# Patient Record
Sex: Male | Born: 1970 | Race: White | Hispanic: No | Marital: Married | Smoking: Never smoker
Health system: Southern US, Community
[De-identification: ages and names within clinical notes are randomized; demographics above are authoritative.]

## PROBLEM LIST (undated history)

## (undated) DIAGNOSIS — I10 Essential (primary) hypertension: Secondary | ICD-10-CM

## (undated) DIAGNOSIS — M545 Low back pain, unspecified: Secondary | ICD-10-CM

## (undated) DIAGNOSIS — R200 Anesthesia of skin: Secondary | ICD-10-CM

## (undated) HISTORY — PX: OTHER SURGICAL HISTORY: SHX169

---

## 2016-08-21 ENCOUNTER — Ambulatory Visit: Payer: Self-pay | Admitting: Specialist

## 2016-09-04 ENCOUNTER — Encounter (HOSPITAL_COMMUNITY): Payer: Self-pay

## 2016-09-04 ENCOUNTER — Encounter (HOSPITAL_COMMUNITY)
Admission: RE | Admit: 2016-09-04 | Discharge: 2016-09-04 | Disposition: A | Discharge status: Home / Self Care | Payer: Managed Care, Other (non HMO) | Source: Ambulatory Visit | Attending: Specialist | Admitting: Specialist

## 2016-09-04 ENCOUNTER — Ambulatory Visit (HOSPITAL_COMMUNITY)
Admission: RE | Admit: 2016-09-04 | Discharge: 2016-09-04 | Disposition: A | Discharge status: Home / Self Care | Payer: Managed Care, Other (non HMO) | Source: Ambulatory Visit | Attending: Specialist | Admitting: Specialist

## 2016-09-04 DIAGNOSIS — M4807 Spinal stenosis, lumbosacral region: Secondary | ICD-10-CM | POA: Insufficient documentation

## 2016-09-04 DIAGNOSIS — M5126 Other intervertebral disc displacement, lumbar region: Secondary | ICD-10-CM

## 2016-09-04 DIAGNOSIS — Z01818 Encounter for other preprocedural examination: Secondary | ICD-10-CM | POA: Insufficient documentation

## 2016-09-04 DIAGNOSIS — I10 Essential (primary) hypertension: Secondary | ICD-10-CM | POA: Diagnosis not present

## 2016-09-04 HISTORY — DX: Anesthesia of skin: R20.0

## 2016-09-04 HISTORY — DX: Low back pain: M54.5

## 2016-09-04 HISTORY — DX: Essential (primary) hypertension: I10

## 2016-09-04 HISTORY — DX: Low back pain, unspecified: M54.50

## 2016-09-04 LAB — CBC
HCT: 41.6 % (ref 39.0–52.0)
Hemoglobin: 14.7 g/dL (ref 13.0–17.0)
MCH: 31.6 pg (ref 26.0–34.0)
MCHC: 35.3 g/dL (ref 30.0–36.0)
MCV: 89.5 fL (ref 78.0–100.0)
PLATELETS: 249 10*3/uL (ref 150–400)
RBC: 4.65 MIL/uL (ref 4.22–5.81)
RDW: 12.7 % (ref 11.5–15.5)
WBC: 6.2 10*3/uL (ref 4.0–10.5)

## 2016-09-04 LAB — BASIC METABOLIC PANEL
Anion gap: 9 (ref 5–15)
BUN: 14 mg/dL (ref 6–20)
CHLORIDE: 104 mmol/L (ref 101–111)
CO2: 26 mmol/L (ref 22–32)
CREATININE: 0.94 mg/dL (ref 0.61–1.24)
Calcium: 9.2 mg/dL (ref 8.9–10.3)
GFR calc Af Amer: >60 mL/min (ref >60)
GFR calc non Af Amer: >60 mL/min (ref >60)
GLUCOSE: 123 mg/dL — AB (ref 65–99)
Potassium: 3.7 mmol/L (ref 3.5–5.1)
Sodium: 139 mmol/L (ref 135–145)

## 2016-09-04 LAB — SURGICAL PCR SCREEN
MRSA, PCR: NEGATIVE
Staphylococcus aureus: NEGATIVE

## 2016-09-04 NOTE — Patient Instructions (Signed)
Jose Logan  09/04/2016   Your procedure is scheduled on: 09-14-16  [facility]  Entrance [place]  elevators to 3rd floor to  Short Stay Center at 530  AM.  Call this number if you have problems the morning of surgery [phone]   Remember: ONLY 1 PERSON MAY GO WITH YOU TO SHORT STAY TO GET  READY MORNING OF YOUR SURGERY.  Do not eat food or drink liquids :After Midnight.     Take these medicines the morning of surgery with A SIP OF WATER: none                               You may not have any metal on your body including hair pins and              piercings  Do not wear jewelry, make-up, lotions, powders or perfumes, deodorant             Do not wear nail polish.  Do not shave  48 hours prior to surgery.              Men may shave face and neck.   Do not bring valuables to the hospital.  IS NOT             RESPONSIBLE   FOR VALUABLES.  Contacts, dentures or bridgework may not be worn into surgery.  Leave suitcase in the car. After surgery it may be brought to your room.                  Please read over the following fact sheets you were given: _____________________________________________________________________             [facility] - Preparing for [facility] Before surgery, you can play an important role.  Because skin is not sterile, your skin needs to be as free of germs as possible.  You can reduce the number of germs on your skin by washing with CHG (chlorahexidine gluconate) soap before surgery.  CHG is an antiseptic cleaner which kills germs and bonds with the skin to continue killing germs even after washing. Please DO NOT use if you have an allergy to CHG or antibacterial soaps.  If your skin becomes reddened/irritated stop using the CHG and inform your nurse when you arrive at Short Stay. Do not shave (including legs and underarms) for at least 48 hours prior to the first CHG shower.  You may shave your  face/neck. Please follow these instructions carefully:  1.  Shower with CHG Soap the night before surgery and the  morning of Surgery.  2.  If you choose to wash your hair, wash your hair first as usual with your  normal  shampoo.  3.  After you shampoo, rinse your hair and body thoroughly to remove the  shampoo.                           4.  Use CHG as you would any other liquid soap.  You can apply chg directly  to the skin and wash                       Gently with a scrungie or clean washcloth.  5.  Apply the CHG Soap to  your body ONLY FROM THE NECK DOWN.   Do not use on face/ open                           Wound or open sores. Avoid contact with eyes, ears mouth and genitals (private parts).                       Wash face,  Genitals (private parts) with your normal soap.             6.  Wash thoroughly, paying special attention to the area where your surgery  will be performed.  7.  Thoroughly rinse your body with warm water from the neck down.  8.  DO NOT shower/wash with your normal soap after using and rinsing off  the CHG Soap.                9.  Pat yourself dry with a clean towel.            10.  Wear clean pajamas.            11.  Place clean sheets on your bed the night of your first shower and do not  sleep with pets. Day of Surgery : Do not apply any lotions/deodorants the morning of surgery.  Please wear clean clothes to the hospital/surgery center.  FAILURE TO FOLLOW THESE INSTRUCTIONS MAY RESULT IN THE CANCELLATION OF YOUR SURGERY PATIENT SIGNATURE_________________________________  NURSE SIGNATURE__________________________________  ________________________________________________________________________   [name]  An incentive spirometer is a tool that can help keep your lungs clear and active. This tool measures how well you are filling your lungs with each breath. Taking long deep breaths may help reverse or decrease the chance of developing breathing  (pulmonary) problems (especially infection) following:  A long period of time when you are unable to move or be active. BEFORE THE PROCEDURE   If the spirometer includes an indicator to show your best effort, your nurse or respiratory therapist will set it to a desired goal.  If possible, sit up straight or lean slightly forward. Try not to slouch.  Hold the incentive spirometer in an upright position. INSTRUCTIONS FOR USE  1. Sit on the edge of your bed if possible, or sit up as far as you can in bed or on a chair. 2. Hold the incentive spirometer in an upright position. 3. Breathe out normally. 4. Place the mouthpiece in your mouth and seal your lips tightly around it. 5. Breathe in slowly and as deeply as possible, raising the piston or the ball toward the top of the column. 6. Hold your breath for 3-5 seconds or for as long as possible. Allow the piston or ball to fall to the bottom of the column. 7. Remove the mouthpiece from your mouth and breathe out normally. 8. Rest for a few seconds and repeat Steps 1 through 7 at least 10 times every 1-2 hours when you are awake. Take your time and take a few normal breaths between deep breaths. 9. The spirometer may include an indicator to show your best effort. Use the indicator as a goal to work toward during each repetition. 10. After each set of 10 deep breaths, practice coughing to be sure your lungs are clear. If you have an incision (the cut made at the time of surgery), support your incision when coughing by placing a pillow or rolled up towels firmly against  it. Once you are able to get out of bed, walk around indoors and cough well. You may stop using the incentive spirometer when instructed by your caregiver.  RISKS AND COMPLICATIONS  Take your time so you do not get dizzy or light-headed.  If you are in pain, you may need to take or ask for pain medication before doing incentive spirometry. It is harder to take a deep breath if you  are having pain. AFTER USE  Rest and breathe slowly and easily.  It can be helpful to keep track of a log of your progress. Your caregiver can provide you with a simple table to help with this. If you are using the spirometer at home, follow these instructions: [place] IF:   You are having difficultly using the spirometer.  You have trouble using the spirometer as often as instructed.  Your pain medication is not giving enough relief while using the spirometer.  You develop fever of 100.5 F (38.1 C) or higher. SEEK IMMEDIATE MEDICAL CARE IF:   You cough up bloody sputum that had not been present before.  You develop fever of 102 F (38.9 C) or greater.  You develop worsening pain at or near the incision site. MAKE SURE YOU:   Understand these instructions.  Will watch your condition.  Will get help right away if you are not doing well or get worse. Document Released: 06/26/2006 Document Revised: 05/08/2011 Document Reviewed: 08/27/2006 [facility] Patient Information 2014 [place], [place].   ________________________________________________________________________

## 2016-09-11 ENCOUNTER — Ambulatory Visit: Payer: Self-pay | Admitting: Orthopedic Surgery

## 2016-09-11 NOTE — H&P (Signed)
Jose Logan is an 46 y.o. male.   Chief Complaint: back and leg pain HPI: The patient is a 46 year old male who presents today for follow up of their back. The patient is being followed for their right-sided back pain and rt leg pain and numbness. They are now 2 week(s) out from Rt. L5-S1 ESI. Symptoms reported today include: pain, pain with weightbearing, numbness, leg pain and foot pain. The patient states that they are doing poorly. The following medication has been used for pain control: none. The patient reports their current pain level to be 3 / 10. The patient presents today following ESI. The patient has reported improvement of their symptoms with: conservative measures and Cortisone injections. The patient indicates that they have questions or concerns today regarding pain and work duties. Note for "Follow-up back": He states he got maybe a day of relief from injection. States he has aching all the time.  Jose Logan follows up with right leg numbness down into the big toe. He is two weeks out from his epidural. He did not have significant relief from that.  Review of systems is negative for fevers, chest pain, shortness of breath, unexplained recent weight loss, loss of bowel or bladder function, burning with urination, joint swelling, rashes, weakness or numbness, difficulty with balance, easy bruising, excessive thirst or frequent urination.  He reports pain now down into the great toe on the right in the bottom aspect of his foot in addition to the lateral aspect of the left foot in the S1 nerve root distribution.  Past Medical History:  Diagnosis Date  . Hypertension   . Lumbar pain   . Numbness    top of right foot and left toes, dr beane aware for last year    Past Surgical History:  Procedure Laterality Date  . hemorroid surgery and colonscopy      No family history on file. Social History:  reports that he has never smoked. He uses smokeless tobacco. He reports that he  drinks alcohol. He reports that he does not use drugs.  Allergies:  Allergies  Allergen Reactions  . Tetracyclines & Related Rash     (Not in a hospital admission)  No results found for this or any previous visit (from the past 48 hour(s)). No results found.  Review of Systems  Constitutional: Negative.   HENT: Negative.   Eyes: Negative.   Respiratory: Negative.   Cardiovascular: Negative.   Gastrointestinal: Negative.   Genitourinary: Negative.   Musculoskeletal: Positive for back pain.  Skin: Negative.   Neurological: Positive for sensory change and focal weakness.  Psychiatric/Behavioral: Negative.     There were no vitals taken for this visit. Physical Exam  Constitutional: He is oriented to person, place, and time. He appears well-developed.  HENT:  Head: Normocephalic.  Eyes: Pupils are equal, round, and reactive to light.  Neck: Normal range of motion.  Cardiovascular: Normal rate.   Respiratory: Effort normal.  GI: Soft.  Musculoskeletal:  On exam, straight leg raise with buttock, thigh and calf pain on the right, buttock and thigh pain on the left. EHL is 4+/5. Trace EHL on the left. Pain with flexion and extension of the lumbar spine. Lumbar spine exam reveals no evidence of soft tissue swelling, deformity or skin ecchymosis. On palpation there is no tenderness of the lumbar spine. No flank pain with percussion. The abdomen is soft and nontender. Nontender over the trochanters. No cellulitis or lymphadenopathy.  Good range of motion  of the lumbar spine without associated pain. Straight leg raise is negative. Motor is 5/5 including EHL, tibialis anterior, plantar flexion, quadriceps and hamstrings. Patient is normoreflexic. There is no Babinski or clonus. Sensory exam is intact to light touch. Patient has good distal pulses. No DVT. No pain and normal range of motion without instability of the hips, knees and ankles.  Neurological: He is alert and oriented to  person, place, and time.  Skin: Skin is warm and dry.    MRI was again reviewed. He does have associated spinal stenosis and lateral recess stenosis at L4-5. His MRI was done at an outside facility. He has moderate central stenosis and bilateral sub-articular recess narrowing which I feel is impinging upon the L5 nerve root, right greater than left. He has a disc protrusion and lateral recess stenosis mildly at L5-S1 level on the left.  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  Assessment/Plan 1. Predominantly right lower extremity radicular pain in the L5 nerve root distribution secondary to disc protrusion, lateral recess stenosis, facet and ligamentum hypertrophy, disc space narrowing, bilateral stenosis, refractory to rest, activity modification and injection. 2. 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  We discussed options as well as a CT myelogram. I do not feel at this point in time that is necessary given the predominant L5 radiculopathy. I discussed a microlumbar decompression at L4-L5 bilaterally and possibly L5-S1 on the left. I had an extensive discussion of the risks and benefits of the lumbar decompression with the patient including bleeding, infection, damage to neurovascular structures, epidural fibrosis, CSF leak requiring repair. We also discussed increase in pain, adjacent segment disease, recurrent disc herniation, need for future surgery including repeat decompression and/or fusion. We also discussed risks of postoperative hematoma, paralysis, anesthetic complications including DVT, PE, death, cardiopulmonary dysfunction. In addition, the perioperative and postoperative courses were discussed in detail including the rehabilitative time and return to functional activity and work. I provided the patient with an illustrated handout and utilized the appropriate surgical models. Again, he has had home exercise program, activity modification  strategies to avoid re-injury.  Hopefully, with an absence or reduction of his radicular pain and with disc pressure management, we can reduce the symptoms and increase his level of activity. Right now, he is out of work and has significant disability with his current activities. I did indicate this is not a procedure designed to address back pain. Based on his multilevel disc degeneration, that would require surgical fusion, multilevel, and at this point, I do not feel that is appropriate. Again, it is predominantly buttock and into the leg at this point.  I will continue his activity modification strategies avoid re-injury. He is currently on crutches today. Again, I feel it is appropriate to proceed at this point given the failure of conservative treatment and the positions of the neural compressive lesion and nerve root and myotomal weakness and dermatomal dysesthesias.  We discussed overnight in the hospital, two weeks for suture removal and six weeks until able to participate in a progressive strengthening program. We spent considerable time discussing all these issues. His other option is to live with his symptoms, which he currently indicates he is unable to do. Current pain management program. Otherwise pretty healthy with some blood pressure issues but currently well maintained.  Plan microlumbar decompression L4-5, L5-S1  Dorothy SparkBISSELL, JACLYN M., PA-C for Dr. Shelle IronBeane 09/11/2016, 12:09 PM

## 2016-09-13 NOTE — Anesthesia Preprocedure Evaluation (Addendum)
Anesthesia Evaluation  Patient identified by MRN, date of birth, ID band Patient awake    Reviewed: Allergy & Precautions, NPO status , Patient's Chart, lab work & pertinent test results  Airway Mallampati: I  TM Distance: >3 FB Neck ROM: Full    Dental  (+) Dental Advisory Given   Pulmonary neg pulmonary ROS,    breath sounds clear to auscultation       Cardiovascular hypertension, Pt. on medications  Rhythm:Regular Rate:Normal     Neuro/Psych negative neurological ROS     GI/Hepatic negative GI ROS, Neg liver ROS,   Endo/Other  negative endocrine ROS  Renal/GU negative Renal ROS     Musculoskeletal   Abdominal   Peds  Hematology negative hematology ROS (+)   Anesthesia Other Findings   Reproductive/Obstetrics                            Lab Results  Component Value Date   WBC 6.2 09/04/2016   HGB 14.7 09/04/2016   HCT 41.6 09/04/2016   MCV 89.5 09/04/2016   PLT 249 09/04/2016   Lab Results  Component Value Date   CREATININE 0.94 09/04/2016   BUN 14 09/04/2016   NA 139 09/04/2016   K 3.7 09/04/2016   CL 104 09/04/2016   CO2 26 09/04/2016    Anesthesia Physical Anesthesia Plan  ASA: II  Anesthesia Plan: General   Post-op Pain Management:    Induction: Intravenous  PONV Risk Score and Plan: 3 and Ondansetron, Dexamethasone, Propofol and Midazolam  Airway Management Planned: Oral ETT  Additional Equipment:   Intra-op Plan:   Post-operative Plan: Extubation in OR  Informed Consent: I have reviewed the patients History and Physical, chart, labs and discussed the procedure including the risks, benefits and alternatives for the proposed anesthesia with the patient or authorized representative who has indicated his/her understanding and acceptance.   Dental advisory given  Plan Discussed with: CRNA  Anesthesia Plan Comments:        Anesthesia Quick  Evaluation

## 2016-09-14 ENCOUNTER — Encounter (HOSPITAL_COMMUNITY)
Admission: RE | Disposition: A | Discharge status: Home / Self Care | Payer: Self-pay | Source: Ambulatory Visit | Attending: Specialist

## 2016-09-14 ENCOUNTER — Ambulatory Visit (HOSPITAL_COMMUNITY): Payer: Managed Care, Other (non HMO)

## 2016-09-14 ENCOUNTER — Ambulatory Visit (HOSPITAL_COMMUNITY)
Admission: RE | Admit: 2016-09-14 | Discharge: 2016-09-14 | Disposition: A | Discharge status: Home / Self Care | Payer: Managed Care, Other (non HMO) | Source: Ambulatory Visit | Attending: Specialist | Admitting: Specialist

## 2016-09-14 ENCOUNTER — Ambulatory Visit (HOSPITAL_COMMUNITY): Payer: Managed Care, Other (non HMO) | Admitting: Anesthesiology

## 2016-09-14 ENCOUNTER — Encounter (HOSPITAL_COMMUNITY): Payer: Self-pay

## 2016-09-14 DIAGNOSIS — M48061 Spinal stenosis, lumbar region without neurogenic claudication: Secondary | ICD-10-CM | POA: Diagnosis not present

## 2016-09-14 DIAGNOSIS — F17290 Nicotine dependence, other tobacco product, uncomplicated: Secondary | ICD-10-CM | POA: Diagnosis not present

## 2016-09-14 DIAGNOSIS — I10 Essential (primary) hypertension: Secondary | ICD-10-CM | POA: Diagnosis not present

## 2016-09-14 DIAGNOSIS — M549 Dorsalgia, unspecified: Secondary | ICD-10-CM | POA: Diagnosis present

## 2016-09-14 HISTORY — PX: LUMBAR LAMINECTOMY/DECOMPRESSION MICRODISCECTOMY: SHX5026

## 2016-09-14 SURGERY — LUMBAR LAMINECTOMY/DECOMPRESSION MICRODISCECTOMY 2 LEVELS
Anesthesia: General | Site: Back

## 2016-09-14 MED ORDER — ROCURONIUM BROMIDE 100 MG/10ML IV SOLN
INTRAVENOUS | Status: DC | PRN
  Administered 2016-09-14 (×2): 10 mg via INTRAVENOUS
  Administered 2016-09-14: 40 mg via INTRAVENOUS

## 2016-09-14 MED ORDER — MENTHOL 3 MG MT LOZG: 1.0000 | LOZENGE | OROMUCOSAL | Status: DC | PRN

## 2016-09-14 MED ORDER — FENTANYL CITRATE (PF) 100 MCG/2ML IJ SOLN
INTRAMUSCULAR | Status: DC | PRN
  Administered 2016-09-14 (×2): 25 ug via INTRAVENOUS
  Administered 2016-09-14: 50 ug via INTRAVENOUS
  Administered 2016-09-14: 100 ug via INTRAVENOUS

## 2016-09-14 MED ORDER — ACETAMINOPHEN 325 MG PO TABS: 650.0000 mg | ORAL_TABLET | ORAL | Status: DC | PRN

## 2016-09-14 MED ORDER — RISAQUAD PO CAPS
1.0000 | ORAL_CAPSULE | Freq: Every day | ORAL | Status: DC
  Administered 2016-09-14: 11:00:00 1 via ORAL
  Filled 2016-09-14: qty 1

## 2016-09-14 MED ORDER — ROCURONIUM BROMIDE 50 MG/5ML IV SOSY
PREFILLED_SYRINGE | INTRAVENOUS | Status: AC
  Filled 2016-09-14: qty 5

## 2016-09-14 MED ORDER — ONDANSETRON HCL 4 MG PO TABS: 4.0000 mg | ORAL_TABLET | Freq: Four times a day (QID) | ORAL | Status: DC | PRN

## 2016-09-14 MED ORDER — PROMETHAZINE HCL 25 MG/ML IJ SOLN: 6.2500 mg | INTRAMUSCULAR | Status: DC | PRN

## 2016-09-14 MED ORDER — DEXAMETHASONE SODIUM PHOSPHATE 10 MG/ML IJ SOLN
INTRAMUSCULAR | Status: DC | PRN
  Administered 2016-09-14: 10 mg via INTRAVENOUS

## 2016-09-14 MED ORDER — PHENOL 1.4 % MT LIQD: 1.0000 | OROMUCOSAL | Status: DC | PRN

## 2016-09-14 MED ORDER — ONDANSETRON HCL 4 MG/2ML IJ SOLN
INTRAMUSCULAR | Status: DC | PRN
  Administered 2016-09-14: 4 mg via INTRAVENOUS

## 2016-09-14 MED ORDER — MIDAZOLAM HCL 5 MG/5ML IJ SOLN
INTRAMUSCULAR | Status: DC | PRN
  Administered 2016-09-14: 2 mg via INTRAVENOUS

## 2016-09-14 MED ORDER — KCL IN DEXTROSE-NACL 20-5-0.45 MEQ/L-%-% IV SOLN
INTRAVENOUS | Status: DC
  Administered 2016-09-14: 11:00:00 50 mL/h via INTRAVENOUS
  Filled 2016-09-14: qty 1000

## 2016-09-14 MED ORDER — LIDOCAINE 2% (20 MG/ML) 5 ML SYRINGE
INTRAMUSCULAR | Status: AC
  Filled 2016-09-14: qty 5

## 2016-09-14 MED ORDER — LOSARTAN POTASSIUM-HCTZ 100-12.5 MG PO TABS: 1.0000 | ORAL_TABLET | Freq: Every day | ORAL | Status: DC

## 2016-09-14 MED ORDER — ACETAMINOPHEN 10 MG/ML IV SOLN
1000.0000 mg | Freq: Once | INTRAVENOUS | Status: AC
  Administered 2016-09-14: 1000 mg via INTRAVENOUS
  Filled 2016-09-14: qty 100

## 2016-09-14 MED ORDER — HYDROCHLOROTHIAZIDE 12.5 MG PO CAPS
12.5000 mg | ORAL_CAPSULE | Freq: Every day | ORAL | Status: DC
  Administered 2016-09-14: 12.5 mg via ORAL
  Filled 2016-09-14: qty 1

## 2016-09-14 MED ORDER — METHOCARBAMOL 1000 MG/10ML IJ SOLN
500.0000 mg | Freq: Four times a day (QID) | INTRAVENOUS | Status: DC | PRN
  Filled 2016-09-14: qty 5

## 2016-09-14 MED ORDER — CHLORHEXIDINE GLUCONATE 4 % EX LIQD: 60.0000 mL | Freq: Once | CUTANEOUS | Status: DC

## 2016-09-14 MED ORDER — DOCUSATE SODIUM 100 MG PO CAPS: 100.0000 mg | ORAL_CAPSULE | Freq: Two times a day (BID) | ORAL | 1 refills | Status: DC | PRN

## 2016-09-14 MED ORDER — FENTANYL CITRATE (PF) 100 MCG/2ML IJ SOLN
INTRAMUSCULAR | Status: AC
  Filled 2016-09-14: qty 2

## 2016-09-14 MED ORDER — ONDANSETRON HCL 4 MG/2ML IJ SOLN
INTRAMUSCULAR | Status: AC
  Filled 2016-09-14: qty 2

## 2016-09-14 MED ORDER — HYDROMORPHONE HCL-NACL 0.5-0.9 MG/ML-% IV SOSY: 0.2500 mg | PREFILLED_SYRINGE | INTRAVENOUS | Status: DC | PRN

## 2016-09-14 MED ORDER — METHOCARBAMOL 500 MG PO TABS: 500.0000 mg | ORAL_TABLET | Freq: Four times a day (QID) | ORAL | 1 refills | Status: DC | PRN

## 2016-09-14 MED ORDER — SODIUM CHLORIDE 0.9 % IV SOLN
INTRAVENOUS | Status: DC | PRN
  Administered 2016-09-14: 500 mL

## 2016-09-14 MED ORDER — OXYCODONE-ACETAMINOPHEN 10-325 MG PO TABS: 1.0000 | ORAL_TABLET | ORAL | 0 refills | Status: DC | PRN

## 2016-09-14 MED ORDER — LOSARTAN POTASSIUM 50 MG PO TABS
100.0000 mg | ORAL_TABLET | Freq: Every day | ORAL | Status: DC
  Administered 2016-09-14: 100 mg via ORAL
  Filled 2016-09-14: qty 2

## 2016-09-14 MED ORDER — ONDANSETRON HCL 4 MG/2ML IJ SOLN: 4.0000 mg | Freq: Four times a day (QID) | INTRAMUSCULAR | Status: DC | PRN

## 2016-09-14 MED ORDER — BISACODYL 5 MG PO TBEC: 5.0000 mg | DELAYED_RELEASE_TABLET | Freq: Every day | ORAL | Status: DC | PRN

## 2016-09-14 MED ORDER — HYDROMORPHONE HCL-NACL 0.5-0.9 MG/ML-% IV SOSY
PREFILLED_SYRINGE | INTRAVENOUS | Status: AC
  Filled 2016-09-14: qty 2

## 2016-09-14 MED ORDER — LACTATED RINGERS IV SOLN
INTRAVENOUS | Status: DC
  Administered 2016-09-14 (×2): via INTRAVENOUS

## 2016-09-14 MED ORDER — ALUM & MAG HYDROXIDE-SIMETH 200-200-20 MG/5ML PO SUSP: 30.0000 mL | Freq: Four times a day (QID) | ORAL | Status: DC | PRN

## 2016-09-14 MED ORDER — METHOCARBAMOL 500 MG PO TABS
500.0000 mg | ORAL_TABLET | Freq: Four times a day (QID) | ORAL | Status: DC | PRN
  Administered 2016-09-14: 500 mg via ORAL
  Filled 2016-09-14: qty 1

## 2016-09-14 MED ORDER — DEXAMETHASONE SODIUM PHOSPHATE 10 MG/ML IJ SOLN
INTRAMUSCULAR | Status: AC
  Filled 2016-09-14: qty 1

## 2016-09-14 MED ORDER — PHENYLEPHRINE 40 MCG/ML (10ML) SYRINGE FOR IV PUSH (FOR BLOOD PRESSURE SUPPORT)
PREFILLED_SYRINGE | INTRAVENOUS | Status: DC | PRN
  Administered 2016-09-14 (×5): 80 ug via INTRAVENOUS

## 2016-09-14 MED ORDER — LIDOCAINE HCL (CARDIAC) 20 MG/ML IV SOLN
INTRAVENOUS | Status: DC | PRN
  Administered 2016-09-14: 100 mg via INTRAVENOUS

## 2016-09-14 MED ORDER — DOCUSATE SODIUM 100 MG PO CAPS
100.0000 mg | ORAL_CAPSULE | Freq: Two times a day (BID) | ORAL | Status: DC
  Administered 2016-09-14: 100 mg via ORAL
  Filled 2016-09-14: qty 1

## 2016-09-14 MED ORDER — MAGNESIUM CITRATE PO SOLN: 1.0000 | Freq: Once | ORAL | Status: DC | PRN

## 2016-09-14 MED ORDER — THROMBIN 5000 UNITS EX SOLR
OROMUCOSAL | Status: DC | PRN
  Administered 2016-09-14: 10000 mL via TOPICAL

## 2016-09-14 MED ORDER — HYDROMORPHONE HCL-NACL 0.5-0.9 MG/ML-% IV SOSY: 1.0000 mg | PREFILLED_SYRINGE | INTRAVENOUS | Status: DC | PRN

## 2016-09-14 MED ORDER — POLYETHYLENE GLYCOL 3350 17 G PO PACK: 17.0000 g | PACK | Freq: Every day | ORAL | 0 refills | Status: DC

## 2016-09-14 MED ORDER — PROPOFOL 10 MG/ML IV BOLUS
INTRAVENOUS | Status: DC | PRN
  Administered 2016-09-14: 200 mg via INTRAVENOUS

## 2016-09-14 MED ORDER — ACETAMINOPHEN 650 MG RE SUPP: 650.0000 mg | RECTAL | Status: DC | PRN

## 2016-09-14 MED ORDER — SUGAMMADEX SODIUM 200 MG/2ML IV SOLN
INTRAVENOUS | Status: DC | PRN
  Administered 2016-09-14: 200 mg via INTRAVENOUS

## 2016-09-14 MED ORDER — MIDAZOLAM HCL 2 MG/2ML IJ SOLN
INTRAMUSCULAR | Status: AC
  Filled 2016-09-14: qty 2

## 2016-09-14 MED ORDER — PROPOFOL 10 MG/ML IV BOLUS
INTRAVENOUS | Status: AC
  Filled 2016-09-14: qty 20

## 2016-09-14 MED ORDER — POLYETHYLENE GLYCOL 3350 17 G PO PACK: 17.0000 g | PACK | Freq: Every day | ORAL | Status: DC | PRN

## 2016-09-14 MED ORDER — THROMBIN 5000 UNITS EX SOLR
CUTANEOUS | Status: AC
  Filled 2016-09-14: qty 10000

## 2016-09-14 MED ORDER — CEFAZOLIN SODIUM-DEXTROSE 2-4 GM/100ML-% IV SOLN
2.0000 g | Freq: Three times a day (TID) | INTRAVENOUS | Status: DC
  Administered 2016-09-14: 15:00:00 2 g via INTRAVENOUS
  Filled 2016-09-14: qty 100

## 2016-09-14 MED ORDER — OXYCODONE HCL 5 MG PO TABS
5.0000 mg | ORAL_TABLET | ORAL | Status: DC | PRN
  Administered 2016-09-14: 5 mg via ORAL
  Filled 2016-09-14: qty 1

## 2016-09-14 MED ORDER — BUPIVACAINE-EPINEPHRINE (PF) 0.5% -1:200000 IJ SOLN
INTRAMUSCULAR | Status: AC
  Filled 2016-09-14: qty 30

## 2016-09-14 MED ORDER — BUPIVACAINE-EPINEPHRINE (PF) 0.5% -1:200000 IJ SOLN
INTRAMUSCULAR | Status: DC | PRN
  Administered 2016-09-14: 18 mL via PERINEURAL

## 2016-09-14 MED ORDER — CEFAZOLIN SODIUM-DEXTROSE 2-4 GM/100ML-% IV SOLN
2.0000 g | INTRAVENOUS | Status: AC
  Administered 2016-09-14: 2 g via INTRAVENOUS
  Filled 2016-09-14: qty 100

## 2016-09-14 SURGICAL SUPPLY — 48 items
BAG ZIPLOCK 12X15 (MISCELLANEOUS) ×3 IMPLANT
CLEANER TIP ELECTROSURG 2X2 (MISCELLANEOUS) ×3 IMPLANT
CLOSURE WOUND 1/2 X4 (GAUZE/BANDAGES/DRESSINGS) ×1
CLOTH 2% CHLOROHEXIDINE 3PK (PERSONAL CARE ITEMS) ×3 IMPLANT
COVER SURGICAL LIGHT HANDLE (MISCELLANEOUS) ×3 IMPLANT
DRAPE MICROSCOPE LEICA (MISCELLANEOUS) ×3 IMPLANT
DRAPE SHEET LG 3/4 BI-LAMINATE (DRAPES) IMPLANT
DRAPE SURG 17X11 SM STRL (DRAPES) ×3 IMPLANT
DRAPE UTILITY XL STRL (DRAPES) ×3 IMPLANT
DRSG AQUACEL AG ADV 3.5X 4 (GAUZE/BANDAGES/DRESSINGS) ×3 IMPLANT
DRSG AQUACEL AG ADV 3.5X 6 (GAUZE/BANDAGES/DRESSINGS) IMPLANT
DURAPREP 26ML APPLICATOR (WOUND CARE) ×3 IMPLANT
DURASEAL SPINE SEALANT 3ML (MISCELLANEOUS) IMPLANT
ELECT BLADE TIP CTD 4 INCH (ELECTRODE) ×3 IMPLANT
ELECT REM PT RETURN 15FT ADLT (MISCELLANEOUS) ×3 IMPLANT
GLOVE BIOGEL PI IND STRL 7.0 (GLOVE) ×1 IMPLANT
GLOVE BIOGEL PI INDICATOR 7.0 (GLOVE) ×2
GLOVE SURG SS PI 7.0 STRL IVOR (GLOVE) ×3 IMPLANT
GLOVE SURG SS PI 8.0 STRL IVOR (GLOVE) ×6 IMPLANT
GOWN STRL REUS W/TWL XL LVL3 (GOWN DISPOSABLE) ×6 IMPLANT
HEMOSTAT SPONGE AVITENE ULTRA (HEMOSTASIS) IMPLANT
IV CATH 14GX2 1/4 (CATHETERS) ×3 IMPLANT
KIT BASIN OR (CUSTOM PROCEDURE TRAY) ×3 IMPLANT
KIT POSITIONING SURG ANDREWS (MISCELLANEOUS) ×3 IMPLANT
MANIFOLD NEPTUNE II (INSTRUMENTS) ×3 IMPLANT
MARKER SKIN DUAL TIP RULER LAB (MISCELLANEOUS) IMPLANT
NEEDLE SPNL 18GX3.5 QUINCKE PK (NEEDLE) ×6 IMPLANT
PACK LAMINECTOMY ORTHO (CUSTOM PROCEDURE TRAY) ×3 IMPLANT
PATTIES SURGICAL .5 X.5 (GAUZE/BANDAGES/DRESSINGS) IMPLANT
PATTIES SURGICAL .75X.75 (GAUZE/BANDAGES/DRESSINGS) ×3 IMPLANT
PATTIES SURGICAL 1X1 (DISPOSABLE) IMPLANT
RUBBERBAND STERILE (MISCELLANEOUS) ×3 IMPLANT
SPONGE LAP 4X18 X RAY DECT (DISPOSABLE) IMPLANT
SPONGE SURGIFOAM ABS GEL 100 (HEMOSTASIS) ×3 IMPLANT
STAPLER VISISTAT (STAPLE) IMPLANT
STRIP CLOSURE SKIN 1/2X4 (GAUZE/BANDAGES/DRESSINGS) ×2 IMPLANT
SUT NURALON 4 0 TR CR/8 (SUTURE) IMPLANT
SUT PROLENE 3 0 PS 2 (SUTURE) ×3 IMPLANT
SUT VIC AB 1 CT1 27 (SUTURE) ×2
SUT VIC AB 1 CT1 27XBRD ANTBC (SUTURE) ×1 IMPLANT
SUT VIC AB 1-0 CT2 27 (SUTURE) ×6 IMPLANT
SUT VIC AB 2-0 CT1 27 (SUTURE)
SUT VIC AB 2-0 CT1 TAPERPNT 27 (SUTURE) IMPLANT
SUT VIC AB 2-0 CT2 27 (SUTURE) ×6 IMPLANT
SYR 3ML LL SCALE MARK (SYRINGE) ×3 IMPLANT
TOWEL OR 17X26 10 PK STRL BLUE (TOWEL DISPOSABLE) ×3 IMPLANT
TOWEL OR NON WOVEN STRL DISP B (DISPOSABLE) ×3 IMPLANT
YANKAUER SUCT BULB TIP NO VENT (SUCTIONS) ×3 IMPLANT

## 2016-09-14 NOTE — Interval H&P Note (Signed)
History and Physical Interval Note:  09/14/2016 6:59 AM  Jose MerlJeffery Krupp  has presented today for surgery, with the diagnosis of Stenosis L4-5, L5-S1  The various methods of treatment have been discussed with the patient and family. After consideration of risks, benefits and other options for treatment, the patient has consented to  Procedure(s) with comments: Decompression L4-5, L5-S1 (N/A) - 120 mins as a surgical intervention .  The patient's history has been reviewed, patient examined, no change in status, stable for surgery.  I have reviewed the patient's chart and labs.  Questions were answered to the patient's satisfaction.     Nichols Corter C

## 2016-09-14 NOTE — Discharge Instructions (Signed)

## 2016-09-14 NOTE — Anesthesia Procedure Notes (Addendum)
Procedure Name: Intubation Date/Time: 09/14/2016 7:35 AM Performed by: Noralyn Pick D Pre-anesthesia Checklist: Patient identified, Emergency Drugs available, Suction available and Patient being monitored Patient Re-evaluated:Patient Re-evaluated prior to induction Oxygen Delivery Method: Circle system utilized Preoxygenation: Pre-oxygenation with 100% oxygen Induction Type: IV induction Ventilation: Mask ventilation without difficulty Laryngoscope Size: Mac and 3 Grade View: Grade I Tube type: Oral Number of attempts: 1 Airway Equipment and Method: Stylet and Oral airway Placement Confirmation: ETT inserted through vocal cords under direct vision,  positive ETCO2 and breath sounds checked- equal and bilateral Secured at: 23 cm Tube secured with: Tape Dental Injury: Teeth and Oropharynx as per pre-operative assessment

## 2016-09-14 NOTE — H&P (View-Only) (Signed)
Jose Logan is an 46 y.o. male.   Chief Complaint: back and leg pain HPI: The patient is a 46 year old male who presents today for follow up of their back. The patient is being followed for their right-sided back pain and rt leg pain and numbness. They are now 2 week(s) out from Rt. L5-S1 ESI. Symptoms reported today include: pain, pain with weightbearing, numbness, leg pain and foot pain. The patient states that they are doing poorly. The following medication has been used for pain control: none. The patient reports their current pain level to be 3 / 10. The patient presents today following ESI. The patient has reported improvement of their symptoms with: conservative measures and Cortisone injections. The patient indicates that they have questions or concerns today regarding pain and work duties. Note for "Follow-up back": He states he got maybe a day of relief from injection. States he has aching all the time.  Jose Logan follows up with right leg numbness down into the big toe. He is two weeks out from his epidural. He did not have significant relief from that.  Review of systems is negative for fevers, chest pain, shortness of breath, unexplained recent weight loss, loss of bowel or bladder function, burning with urination, joint swelling, rashes, weakness or numbness, difficulty with balance, easy bruising, excessive thirst or frequent urination.  He reports pain now down into the great toe on the right in the bottom aspect of his foot in addition to the lateral aspect of the left foot in the S1 nerve root distribution.  Past Medical History:  Diagnosis Date  . Hypertension   . Lumbar pain   . Numbness    top of right foot and left toes, dr beane aware for last year    Past Surgical History:  Procedure Laterality Date  . hemorroid surgery and colonscopy      No family history on file. Social History:  reports that he has never smoked. He uses smokeless tobacco. He reports that he  drinks alcohol. He reports that he does not use drugs.  Allergies:  Allergies  Allergen Reactions  . Tetracyclines & Related Rash     (Not in a hospital admission)  No results found for this or any previous visit (from the past 48 hour(s)). No results found.  Review of Systems  Constitutional: Negative.   HENT: Negative.   Eyes: Negative.   Respiratory: Negative.   Cardiovascular: Negative.   Gastrointestinal: Negative.   Genitourinary: Negative.   Musculoskeletal: Positive for back pain.  Skin: Negative.   Neurological: Positive for sensory change and focal weakness.  Psychiatric/Behavioral: Negative.     There were no vitals taken for this visit. Physical Exam  Constitutional: He is oriented to person, place, and time. He appears well-developed.  HENT:  Head: Normocephalic.  Eyes: Pupils are equal, round, and reactive to light.  Neck: Normal range of motion.  Cardiovascular: Normal rate.   Respiratory: Effort normal.  GI: Soft.  Musculoskeletal:  On exam, straight leg raise with buttock, thigh and calf pain on the right, buttock and thigh pain on the left. EHL is 4+/5. Trace EHL on the left. Pain with flexion and extension of the lumbar spine. Lumbar spine exam reveals no evidence of soft tissue swelling, deformity or skin ecchymosis. On palpation there is no tenderness of the lumbar spine. No flank pain with percussion. The abdomen is soft and nontender. Nontender over the trochanters. No cellulitis or lymphadenopathy.  Good range of motion   of the lumbar spine without associated pain. Straight leg raise is negative. Motor is 5/5 including EHL, tibialis anterior, plantar flexion, quadriceps and hamstrings. Patient is normoreflexic. There is no Babinski or clonus. Sensory exam is intact to light touch. Patient has good distal pulses. No DVT. No pain and normal range of motion without instability of the hips, knees and ankles.  Neurological: He is alert and oriented to  person, place, and time.  Skin: Skin is warm and dry.    MRI was again reviewed. He does have associated spinal stenosis and lateral recess stenosis at L4-5. His MRI was done at an outside facility. He has moderate central stenosis and bilateral sub-articular recess narrowing which I feel is impinging upon the L5 nerve root, right greater than left. He has a disc protrusion and lateral recess stenosis mildly at L5-S1 level on the left.  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  Assessment/Plan 1. Predominantly right lower extremity radicular pain in the L5 nerve root distribution secondary to disc protrusion, lateral recess stenosis, facet and ligamentum hypertrophy, disc space narrowing, bilateral stenosis, refractory to rest, activity modification and injection. 2. 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  We discussed options as well as a CT myelogram. I do not feel at this point in time that is necessary given the predominant L5 radiculopathy. I discussed a microlumbar decompression at L4-L5 bilaterally and possibly L5-S1 on the left. I had an extensive discussion of the risks and benefits of the lumbar decompression with the patient including bleeding, infection, damage to neurovascular structures, epidural fibrosis, CSF leak requiring repair. We also discussed increase in pain, adjacent segment disease, recurrent disc herniation, need for future surgery including repeat decompression and/or fusion. We also discussed risks of postoperative hematoma, paralysis, anesthetic complications including DVT, PE, death, cardiopulmonary dysfunction. In addition, the perioperative and postoperative courses were discussed in detail including the rehabilitative time and return to functional activity and work. I provided the patient with an illustrated handout and utilized the appropriate surgical models. Again, he has had home exercise program, activity modification  strategies to avoid re-injury.  Hopefully, with an absence or reduction of his radicular pain and with disc pressure management, we can reduce the symptoms and increase his level of activity. Right now, he is out of work and has significant disability with his current activities. I did indicate this is not a procedure designed to address back pain. Based on his multilevel disc degeneration, that would require surgical fusion, multilevel, and at this point, I do not feel that is appropriate. Again, it is predominantly buttock and into the leg at this point.  I will continue his activity modification strategies avoid re-injury. He is currently on crutches today. Again, I feel it is appropriate to proceed at this point given the failure of conservative treatment and the positions of the neural compressive lesion and nerve root and myotomal weakness and dermatomal dysesthesias.  We discussed overnight in the hospital, two weeks for suture removal and six weeks until able to participate in a progressive strengthening program. We spent considerable time discussing all these issues. His other option is to live with his symptoms, which he currently indicates he is unable to do. Current pain management program. Otherwise pretty healthy with some blood pressure issues but currently well maintained.  Plan microlumbar decompression L4-5, L5-S1  BISSELL, JACLYN M., PA-C for Dr. Beane 09/11/2016, 12:09 PM   

## 2016-09-14 NOTE — Evaluation (Signed)
Physical Therapy Evaluation Patient Details Name: Jose Logan MRN: [id number] DOB: 05-26-1970 Today's Date: 09/14/2016   History of Present Illness  Pt s/p L4-5 microlumbar decompression  Clinical Impression  Pt admitted as above and presenting with functional mobility limitations 2* post op pain and back precautions.  Pt educated on back precautions and basic mobility techniques and currently mobilizing at sup > IND.  Pt plans dc home with family assist this date.   Follow Up Recommendations No PT follow up    Equipment Recommendations  None recommended by PT    Recommendations for Other Services       Precautions / Restrictions Precautions Precautions: Fall;Back Precaution Booklet Issued: Yes (comment) Precaution Comments: Reviewed back precautions x 2 Restrictions Weight Bearing Restrictions: No      Mobility  Bed Mobility Overal bed mobility: Needs Assistance Bed Mobility: Supine to Sit     Supine to sit: Min guard;Supervision     General bed mobility comments: cues for log roll technique and adherence to back precautions  Transfers Overall transfer level: Needs assistance   Transfers: Sit to/from Stand Sit to Stand: Min guard;Supervision         General transfer comment: cues for transition position, use of UEs to self assist and adherence to back precautions  Ambulation/Gait Ambulation/Gait assistance: Min guard;Independent Ambulation Distance (Feet): 400 Feet Assistive device: None Gait Pattern/deviations: Step-through pattern;Decreased step length - right;Decreased step length - left;Shuffle Gait velocity: decr Gait velocity interpretation: Below normal speed for age/gender General Gait Details: slight increased BOS, good stability  Stairs Stairs: Yes Stairs assistance: Min guard Stair Management: Two rails;Alternating pattern;Forwards Number of Stairs: 2    Wheelchair Mobility    Modified Rankin (Stroke Patients Only)       Balance  Overall balance assessment: No apparent balance deficits (not formally assessed)                                           Pertinent Vitals/Pain Pain Assessment: 0-10 Pain Score: 3  Pain Location: back Pain Descriptors / Indicators: Sore;Tightness Pain Intervention(s): Limited activity within patient's tolerance;Monitored during session;Premedicated before session    Home Living Family/patient expects to be discharged to:: Private residence Living Arrangements: Spouse/significant other Available Help at Discharge: Family Type of Home: House Home Access: Stairs to enter Entrance Stairs-Rails: Right;Left;Can reach both Secretary/administrator of Steps: 2 Home Layout: One level Home Equipment: None Additional Comments: Wife states she can borrow RW from friend if needed    Prior Function Level of Independence: Independent               Hand Dominance        Extremity/Trunk Assessment   Upper Extremity Assessment Upper Extremity Assessment: Overall WFL for tasks assessed    Lower Extremity Assessment Lower Extremity Assessment: Overall WFL for tasks assessed    Cervical / Trunk Assessment Cervical / Trunk Assessment: Normal  Communication   Communication: No difficulties  Cognition Arousal/Alertness: Awake/alert Behavior During Therapy: WFL for tasks assessed/performed Overall Cognitive Status: Within Functional Limits for tasks assessed                                        General Comments      Exercises     Assessment/Plan    PT Assessment  Patent does not need any further PT services  PT Problem List Decreased activity tolerance;Decreased mobility;Pain;Decreased knowledge of precautions       PT Treatment Interventions DME instruction;Gait training;Stair training;Functional mobility training;Therapeutic activities;Patient/family education    PT Goals (Current goals can be found in the Care Plan section)  Acute  Rehab PT Goals Patient Stated Goal: HOME ASAP PT Goal Formulation: All assessment and education complete, DC therapy    Frequency Min 1X/week   Barriers to discharge        Co-evaluation               AM-PAC PT "6 Clicks" Daily Activity  Outcome Measure Difficulty turning over in bed (including adjusting bedclothes, sheets and blankets)?: A Little Difficulty moving from lying on back to sitting on the side of the bed? : A Little Difficulty sitting down on and standing up from a chair with arms (e.g., wheelchair, bedside commode, etc,.)?: A Little Help needed moving to and from a bed to chair (including a wheelchair)?: A Little Help needed walking in hospital room?: None Help needed climbing 3-5 steps with a railing? : A Little 6 Click Score: 19    End of Session   Activity Tolerance: Patient tolerated treatment well Patient left: in chair;with call bell/phone within reach;with family/visitor present Nurse Communication: Mobility status PT Visit Diagnosis: Difficulty in walking, not elsewhere classified (R26.2)    Time: [id number] PT Time Calculation (min) (ACUTE ONLY): 22 min   Charges:   PT Evaluation $PT Eval Low Complexity: 1 Procedure     PT G Codes:   PT G-Codes **NOT FOR INPATIENT CLASS** Functional Assessment Tool Used: Clinical judgement Functional Limitation: Mobility: Walking and moving around Mobility: Walking and Moving Around Current Status ([id number]([id number]): At least 1 percent but less than 20 percent impaired, limited or restricted Mobility: Walking and Moving Around Goal Status [phone](G8979): At least 1 percent but less than 20 percent impaired, limited or restricted Mobility: Walking and Moving Around Discharge Status [phone](G8980): At least 1 percent but less than 20 percent impaired, limited or restricted    Pg [phone]   Eulogia Dismore 09/14/2016, 3:17 PM

## 2016-09-14 NOTE — Brief Op Note (Signed)
09/14/2016  9:14 AM  PATIENT:  Jose Logan  46 y.o. male  PRE-OPERATIVE DIAGNOSIS:  Stenosis L4-5, L5-S1  POST-OPERATIVE DIAGNOSIS:  Decompressed nerve l4- l5 and l5-s1  PROCEDURE:  Procedure(s) with comments: Bilateral Decompression L4-5 (N/A) - 120 mins  SURGEON:  Surgeon(s) and Role:    Jene Every* Dawaun Brancato, MD - Primary  PHYSICIAN ASSISTANT:   ASSISTANTS: Bissell   ANESTHESIA:   general  EBL:  Total I/O In: -  Out: 120 [Urine:120]  BLOOD ADMINISTERED:none  DRAINS: none   LOCAL MEDICATIONS USED:  MARCAINE     SPECIMEN:  No Specimen  DISPOSITION OF SPECIMEN:  N/A  COUNTS:  YES  TOURNIQUET:  * No tourniquets in log *  DICTATION: .Other Dictation: Dictation Number [id number]  PLAN OF CARE: Admit for overnight observation  PATIENT DISPOSITION:  PACU - hemodynamically stable.   Delay start of Pharmacological VTE agent (>24hrs) due to surgical blood loss or risk of bleeding: yes

## 2016-09-14 NOTE — Op Note (Signed)
NAME:  Jose Logan, Jose Logan                   ACCOUNT NO.:  MEDICAL RECORD NO.:  [id number]  LOCATION:                                 FACILITY:  PHYSICIAN:  Jene EveryJeffrey Deyton Ellenbecker, M.D.         DATE OF BIRTH:  DATE OF PROCEDURE:  09/14/2016 DATE OF DISCHARGE:                              OPERATIVE REPORT   PREOPERATIVE DIAGNOSIS:  Spinal stenosis, L4-5; possible L5-S1.  POSTOPERATIVE DIAGNOSIS:  Spinal stenosis, L4-5; possible L5-S1.  PROCEDURE PERFORMED: 1. Microlumbar decompression bilaterally at L4-5 with separate fascial     incisions. 2. Bilateral foraminotomies of L4 and L5.  ANESTHESIA:  General.  ASSISTANT:  Lanna PocheJacqueline Bissell, PA, for use of operating microscope.  HISTORY:  A 45, bilateral lower extremity radicular pain, basically L5 nerve root distribution with EHL weakness bilaterally.  Possible contribution L5-S1 on the left.  He had MRI indicating lateral recess stenosis, had temporary relief from epidural steroid injections, indicated for bilateral decompression, decompressed the L5 root and evaluate the possibility of S1 nerve root on the left.  Risks and benefits were discussed including bleeding, infection, damage to neurovascular structures, no change in symptoms, worsening symptoms, DVT, PE, anesthetic complications, etc.  TECHNIQUE:  With the patient in supine position, after induction of adequate general anesthesia, 2 g of Kefzol, placed prone on the GirardAndrews frame.  All bony prominences were well padded.  Lumbar region was prepped and draped in usual sterile fashion.  Two 18-gauge spinal needles were utilized to localize the L4-5 interspace, confirmed with x- ray.  Incision was made from spinous process above L4 to below L5. Subcutaneous tissue was dissected.  Electrocautery was utilized to achieve hemostasis.  Marcaine 0.25% with epinephrine was infiltrated in the perimuscular tissue.  It was elevated from lamina of L4-L5. McCulloch retractor was placed.   Operating microscope was draped and brought on the surgical field.  Confirmatory radiograph obtained.  We identified L4-L5, again first on the right.  Hemilaminotomy of caudad edge of L5 was performed with 2 and 3 mm Kerrison.  At that point, detached the ligamentum flavum.  Micro curette utilized to detach the ligamentum flavum from the cephalad edge of L5.  Neuro patty placed beneath.  We removed ligamentum flavum from the interspace. Hypertrophic ligamentum flavum was noted compressing the L5 root in the lateral recess.  Identified the L5 root, gently mobilized it medially and decompressed lateral recess to the medial border of the pedicle, performed a foraminotomy of L4 and a generous foraminotomy of L5. Epidural venous plexus was noted and bipolar electrocautery was utilized to achieve hemostasis.  No disk herniation was noted.  I then passed a Woodson retractor at the foramen of L4 and L5, both widely patent and above the pedicle of L4.  No residual compression was noted.  We felt the L5 root was well decompressed.  Copiously irrigated with antibiotic irrigation.  Bone wax on the cancellous surface.  Thrombin-soaked Gelfoam in the laminotomy defect.  I then turned attention towards the left.  In similar fashion, hemilaminotomies were performed at L5 and L4 preserving the pars as we did bilaterally performed generous foraminotomy of L5.  Identifying the  L5 root was compressed in the lateral recess.  Protecting the L5 root, decompressed the lateral recess to the medial border of the pedicle, performed a foraminotomy of L4, preserved the pars of L4.  No disk herniation was noted.  Epidural venous plexus was again encountered and was cauterized.  Generous foraminotomy of L5 was performed following the L5 root out.  He had some neural foraminal narrowing at L5-S1.  Then, placed a Woodson retractor at the foramen of L4 beneath the residual neural arch of L5 and it was widely patent as was  the arch and down to the pedicle of S1.  There was no stenosis noted and therefore felt unnecessary to get through additional level that was a fibroid in general given the pathology noted.  Copious irrigation, no evidence of CSF leakage or active bleeding.  Thrombin-soaked Gelfoam was placed in laminotomy defect. Removed the McCulloch retractor, irrigated the paraspinous musculature, closed the dorsolumbar fascia with #1 Vicryl, subcu with 2-0, and skin with Prolene.  Sterile dressing applied.  Placed supine on the hospital bed, extubated without difficulty, and transported to the recovery room in satisfactory condition.  The patient tolerated the procedure well.  No complications.  Assistant, Lanna Poche, PA was used throughout the case for the patient positioning, gentle intermittent neural traction, closure.  Minimal blood loss.     Jene Every, M.D.     Cordelia Pen  D:  09/14/2016  T:  09/14/2016  Job:  [id number]

## 2016-09-14 NOTE — Transfer of Care (Signed)
Immediate Anesthesia Transfer of Care Note  Patient: Jose MerlJeffery Genter  Procedure(s) Performed: Procedure(s) with comments: Bilateral Decompression L4-5 (N/A) - 120 mins  Patient Location: PACU  Anesthesia Type:General  Level of Consciousness: awake, alert  and oriented  Airway & Oxygen Therapy: Patient Spontanous Breathing and Patient connected to face mask oxygen  Post-op Assessment: Report given to RN and Post -op Vital signs reviewed and stable  Post vital signs: Reviewed and stable  Last Vitals:  Vitals:   09/14/16 0513  BP: (!) 145/92  Pulse: 72  Resp: 16  Temp: 36.8 C    Last Pain:  Vitals:   09/14/16 0513  TempSrc: Oral      Patients Stated Pain Goal: 4 (09/14/16 0537)  Complications: No apparent anesthesia complications

## 2016-09-14 NOTE — Discharge Summary (Signed)
Physician Discharge Summary   Patient ID: Jose Logan MRN: [id number] DOB/AGE: 08-07-1970 46 y.o.  Admit date: 09/14/2016 Discharge date: 09/14/2016  Primary Diagnosis:   Stenosis L4-5, L5-S1  Admission Diagnoses:  Past Medical History:  Diagnosis Date  . Hypertension   . Lumbar pain   . Numbness    top of right foot and left toes, dr beane aware for last year   Discharge Diagnoses:   Principal Problem:   Spinal stenosis of lumbar region Active Problems:   Spinal stenosis at L4-L5 level  Procedure:  Procedure(s) (LRB): Bilateral Decompression L4-5 (N/A)   Consults: None  HPI:  see H&P    Laboratory Data: Hospital Outpatient Visit on 09/04/2016  Component Date Value Ref Range Status  . Sodium 09/04/2016 139  135 - 145 mmol/L Final  . Potassium 09/04/2016 3.7  3.5 - 5.1 mmol/L Final  . Chloride 09/04/2016 104  101 - 111 mmol/L Final  . CO2 09/04/2016 26  22 - 32 mmol/L Final  . Glucose, Bld 09/04/2016 123* 65 - 99 mg/dL Final  . BUN 09/04/2016 14  6 - 20 mg/dL Final  . Creatinine, Ser 09/04/2016 0.94  0.61 - 1.24 mg/dL Final  . Calcium 09/04/2016 9.2  8.9 - 10.3 mg/dL Final  . GFR calc non Af Amer 09/04/2016 >60  >60 mL/min Final  . GFR calc Af Amer 09/04/2016 >60  >60 mL/min Final   Comment: (NOTE) The eGFR has been calculated using the CKD EPI equation. This calculation has not been validated in all clinical situations. eGFR's persistently <60 mL/min signify possible Chronic Kidney Disease.   . Anion gap 09/04/2016 9  5 - 15 Final  . WBC 09/04/2016 6.2  4.0 - 10.5 K/uL Final  . RBC 09/04/2016 4.65  4.22 - 5.81 MIL/uL Final  . Hemoglobin 09/04/2016 14.7  13.0 - 17.0 g/dL Final  . HCT 09/04/2016 41.6  39.0 - 52.0 % Final  . MCV 09/04/2016 89.5  78.0 - 100.0 fL Final  . MCH 09/04/2016 31.6  26.0 - 34.0 pg Final  . MCHC 09/04/2016 35.3  30.0 - 36.0 g/dL Final  . RDW 09/04/2016 12.7  11.5 - 15.5 % Final  . Platelets 09/04/2016 249  150 - 400 K/uL Final  .  MRSA, PCR 09/04/2016 NEGATIVE  NEGATIVE Final  . Staphylococcus aureus 09/04/2016 NEGATIVE  NEGATIVE Final   Comment:        The Xpert SA Assay (FDA approved for NASAL specimens in patients over 45 years of age), is one component of a comprehensive surveillance program.  Test performance has been validated by [facility] for patients greater than or equal to 72 year old. It is not intended to diagnose infection nor to guide or monitor treatment.    No results for input(s): HGB in the last 72 hours. No results for input(s): WBC, RBC, HCT, PLT in the last 72 hours. No results for input(s): NA, K, CL, CO2, BUN, CREATININE, GLUCOSE, CALCIUM in the last 72 hours. No results for input(s): LABPT, INR in the last 72 hours.  X-Rays:Dg Lumbar Spine 2-3 Views  Result Date: 09/04/2016 CLINICAL DATA:  46 year old male. Preoperative exam. Subsequent encounter. EXAM: LUMBAR SPINE - 2-3 VIEW COMPARISON:  06/30/2016 MR. FINDINGS: Five non rib-bearing lumbar type vertebra with last fully open disc space labeled L5-S1. L3-4 and L4-5 mild disc space narrowing. Moderate L5-S1 disc space narrowing. IMPRESSION: Moderate L5-S1 disc space narrowing. Mild L3-4 and L4-5 disc space narrowing. Electronically Signed   By: Remo Lipps  Jeannine Kitten M.D.   On: 09/04/2016 14:30   Dg Spine Portable 1 View  Result Date: 09/14/2016 CLINICAL DATA:  Lumbar spine surgery. EXAM: PORTABLE SPINE - 1 VIEW COMPARISON:  Intraoperative radiograph earlier today at 0810 hours FINDINGS: A single intraoperative lateral radiograph of the lumbar spine is provided. The tip of a surgical probe projects at the posterior margin of the L4-5 disc space, directed at the L4 inferior endplate. Tissue retractors are present more posteriorly. IMPRESSION: Intraoperative localization as above. Electronically Signed   By: Logan Bores M.D.   On: 09/14/2016 09:24   Dg Spine Portable 1 View  Result Date: 09/14/2016 CLINICAL DATA:  Intraoperative localization EXAM:  PORTABLE SPINE - 1 VIEW COMPARISON:  Film from earlier in the same day FINDINGS: The numbering nomenclature utilized is similar to that seen on the prior exam. Surgical retractors in instruments are noted at the L5 level. IMPRESSION: Intraoperative localization at L5. Electronically Signed   By: Inez Catalina M.D.   On: 09/14/2016 08:16   Dg Spine Portable 1 View  Result Date: 09/14/2016 CLINICAL DATA:  Back pain.  Intraoperative numbering EXAM: PORTABLE SPINE - 1 VIEW COMPARISON:  Lumbar spine MRI 06/30/2016 FINDINGS: Standard segmentation based on 06/30/2016 MRI. Spinous processes were numbered as requested. There are 2 spinal needles which project in line with the L4 and L5 spinous processes. No new osseous finding compared to prior. IMPRESSION: Intraoperative localization with spinal needles at the level of L4 and L5 spinous processes. Electronically Signed   By: Monte Fantasia M.D.   On: 09/14/2016 08:05    EKG: Orders placed or performed during the hospital encounter of 09/04/16  . EKG 12 lead  . EKG 12 lead     Hospital Course: Patient was admitted to [facility] and taken to the OR and underwent the above state procedure without complications.  Patient tolerated the procedure well and was later transferred to the recovery room and then to the orthopaedic floor for postoperative care.  They were given PO and IV analgesics for pain control following their surgery.  They were given postoperative antibiotics.   PT was consulted postop to assist with mobility and transfers.  The patient was allowed to be WBAT with therapy and was taught back precautions. Discharge planning was consulted to help with postop disposition and equipment needs.  Patient started to get up OOB with therapy on day of surgery. Patient was seen in rounds and was ready to go home on day one.  They were given discharge instructions and dressing directions.  They were instructed on when to follow up in the office with Dr.  Tonita Cong.   Diet: Regular diet Activity:WBAT; Lspine precautions Follow-up:in 10-14 days Disposition - Home Discharged Condition: good   Discharge Instructions    Call MD / Call 911    Complete by:  As directed    If you experience chest pain or shortness of breath, CALL 911 and be transported to the hospital emergency room.  If you develope a fever above 101 F, pus (white drainage) or increased drainage or redness at the wound, or calf pain, call your surgeon's office.   Constipation Prevention    Complete by:  As directed    Drink plenty of fluids.  Prune juice may be helpful.  You may use a stool softener, such as Colace (over the counter) 100 mg twice a day.  Use MiraLax (over the counter) for constipation as needed.   Diet - low sodium heart healthy  Complete by:  As directed    Increase activity slowly as tolerated    Complete by:  As directed      Allergies as of 09/14/2016      Reactions   Tetracyclines & Related Rash      Medication List    TAKE these medications   docusate sodium 100 MG capsule Commonly known as:  COLACE Take 1 capsule (100 mg total) by mouth 2 (two) times daily as needed for mild constipation.   losartan-hydrochlorothiazide 100-12.5 MG tablet Commonly known as:  HYZAAR Take 1 tablet by mouth daily. Resume day after surgery What changed:  additional instructions   methocarbamol 500 MG tablet Commonly known as:  ROBAXIN Take 1 tablet (500 mg total) by mouth every 6 (six) hours as needed for muscle spasms.   oxyCODONE-acetaminophen 10-325 MG tablet Commonly known as:  PERCOCET Take 1-2 tablets by mouth every 4 (four) hours as needed for pain (severe).   polyethylene glycol packet Commonly known as:  MIRALAX / GLYCOLAX Take 17 g by mouth daily.      Follow-up Information    Susa Day, MD Follow up in 2 week(s).   Specialty:  Orthopedic Surgery Contact information: [address] [place]  [address] [phone]           Signed: Lacie Draft, PA-C Orthopaedic Surgery 09/14/2016, 3:10 PM

## 2016-09-14 NOTE — Anesthesia Postprocedure Evaluation (Signed)
Anesthesia Post Note  Patient: Jose Logan  Procedure(s) Performed: Procedure(s) (LRB): Bilateral Decompression L4-5 (N/A)     Patient location during evaluation: PACU Anesthesia Type: General Level of consciousness: awake and alert Pain management: pain level controlled Vital Signs Assessment: post-procedure vital signs reviewed and stable Respiratory status: spontaneous breathing, nonlabored ventilation, respiratory function stable and patient connected to nasal cannula oxygen Cardiovascular status: blood pressure returned to baseline and stable Postop Assessment: no signs of nausea or vomiting Anesthetic complications: no    Last Vitals:  Vitals:   09/14/16 1000 09/14/16 1014  BP: 116/67 120/76  Pulse: 78 70  Resp: 13 15  Temp: 36.7 C 36.8 C    Last Pain:  Vitals:   09/14/16 1014  TempSrc: Oral  PainSc: 0-No pain                 Kennieth RadFitzgerald, Roslin Norwood E

## 2017-01-25 ENCOUNTER — Ambulatory Visit: Payer: Managed Care, Other (non HMO) | Admitting: Neurology

## 2017-01-25 ENCOUNTER — Encounter (INDEPENDENT_AMBULATORY_CARE_PROVIDER_SITE_OTHER): Payer: Self-pay

## 2017-01-25 ENCOUNTER — Encounter: Payer: Self-pay | Admitting: Neurology

## 2017-01-25 VITALS — BP 144/93 | HR 79 | Ht 69.0 in | Wt 207.5 lb

## 2017-01-25 DIAGNOSIS — M545 Low back pain, unspecified: Secondary | ICD-10-CM | POA: Insufficient documentation

## 2017-01-25 DIAGNOSIS — R269 Unspecified abnormalities of gait and mobility: Secondary | ICD-10-CM

## 2017-01-25 DIAGNOSIS — R202 Paresthesia of skin: Secondary | ICD-10-CM | POA: Insufficient documentation

## 2017-01-25 MED ORDER — NORTRIPTYLINE HCL 25 MG PO CAPS: 50.0000 mg | ORAL_CAPSULE | Freq: Every day | ORAL | 11 refills | Status: DC

## 2017-01-25 NOTE — Progress Notes (Signed)
PATIENT: Jose MerlJeffery Wotton DOB: 05/27/1970  Chief Complaint  Patient presents with  . New Patient (Initial Visit)    new room. PCP: Dr. Donzetta Sprungerry Daniel     HISTORICAL  Jose Logan 46 year old male, seen in refer by orthopedic surgeon Dr. Jene EveryBeane, Jeffrey for evaluation of bilateral lower extremity paresthesia, initial evaluation was on January 25, 2017  I reviewed and summarized the referring note, he works at [employer] job, which require heavy lifting, wearing heavy boots, walking constantly, since 2016, he noticed right toe numbness, shortly afterwards, did become bilateral feet numbness tingling needle prick pain, mainly involving plantar surface, bilateral toes, also complains of worsening chronic low back pain, especially since 2017, constant, 8 out of 10 with midline low back pain, but he denies radiating pain, no bowel or bladder incontinence, worsening bilateral feet paresthesia after bearing weight, he denies upper extremity paresthesia,  He was seen by orthopedic surgeon Dr. Jillyn HiddenBean, MRI of lumbar spine in May 2018 showed ligamentum flavum thickening with disc bulging at L4-5, cause moderate canal stenosis, bilateral subarticular recess narrowing with potential compression of bilateral L5 nerve roots,  He had microlumbar decompression bilaterally at L4-5 with separate fascial incision, bilateral foraminotomies of on September 14, 2016 by Dr. 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 Tetracyclines & Related Rash    HOME MEDICATIONS: Current Outpatient Medications  Medication Sig Dispense Refill  . losartan-hydrochlorothiazide (HYZAAR) 100-12.5 MG tablet Take 1 tablet by mouth daily. Resume day after surgery     No current facility-administered medications for this visit.     PAST MEDICAL HISTORY: Past Medical History:  Diagnosis Date  . Hypertension   . Lumbar pain   . Numbness    top of right foot and left toes, dr beane aware for last year    PAST SURGICAL HISTORY: Past Surgical History:  Procedure Laterality Date  . hemorroid surgery and colonscopy    . LUMBAR LAMINECTOMY/DECOMPRESSION MICRODISCECTOMY N/A 09/14/2016   Procedure: Bilateral Decompression L4-5;  Surgeon: Jene EveryBeane, Jeffrey, MD;  Location: [facility];  Service: Orthopedics;  Laterality: N/A;  120 mins    FAMILY HISTORY: No family history on file.  SOCIAL HISTORY:  Social History   Socioeconomic History  . Marital status: Married    Spouse name: Not on file  . Number of children: Not on file  . Years of education: Not on file  . Highest education level: Not on file  Social Needs  . Financial resource strain: Not on file  . Food insecurity - worry: Not on file  . Food insecurity - inability: Not on file  . Transportation needs - medical: Not on file  . Transportation needs - non-medical: Not on file  Occupational History  . Not on file  Tobacco Use  . Smoking status: Never Smoker  . Smokeless tobacco: Current User  Substance and Sexual Activity  . Alcohol use: Yes    Comment: 3-4 beers per day  . Drug use: No  . Sexual activity: Not on file  Other Topics Concern  . Not on file  Social History  Narrative  . Not on file     PHYSICAL EXAM   Vitals:   01/25/17 0948  BP: (!) 144/93  Pulse: 79  Weight: 207 lb 8 oz (94.1 kg)  Height: 5\' 9"  (1.753 m)    Not recorded      Body mass index is 30.64 kg/m.  PHYSICAL EXAMNIATION:  Gen: NAD, conversant, well nourised, obese, well groomed                     Cardiovascular: Regular rate rhythm, no peripheral edema, warm, nontender. Eyes: Conjunctivae clear without exudates or hemorrhage Neck: Supple, no carotid bruits. Pulmonary: Clear to auscultation bilaterally   NEUROLOGICAL EXAM:  MENTAL STATUS: Speech:    Speech is normal; fluent and spontaneous with normal comprehension.  Cognition:     Orientation to time, place and person     Normal recent and remote memory     Normal Attention span and concentration     Normal Language, naming, repeating,spontaneous speech     Fund of knowledge   CRANIAL NERVES: CN II: Visual fields are full to confrontation. Fundoscopic exam is normal with sharp discs and no vascular changes. Pupils are round equal and briskly reactive to light. CN III, IV, VI: extraocular movement are normal. No ptosis. CN V: Facial sensation is intact to pinprick in all 3 divisions bilaterally. Corneal responses are intact.  CN VII: Face is symmetric with normal eye closure and smile. CN VIII: Hearing is normal to rubbing fingers CN IX, X: Palate elevates symmetrically. Phonation is normal. CN XI: Head turning and shoulder shrug are intact CN XII: Tongue is midline with normal movements and no atrophy.  MOTOR: There is no pronator drift of out-stretched arms. Muscle bulk and tone are normal. Muscle strength is normal.  REFLEXES: Reflexes are 2+ and symmetric at the biceps, triceps, knees, and ankles. Plantar responses are flexor.  SENSORY: Lens dependent decreased to light touch, pinprick and vibratory sensation toes  COORDINATION: Rapid alternating movements and fine finger movements are intact.  There is no dysmetria on finger-to-nose and heel-knee-shin.    GAIT/STANCE: Gait antalgic, able to stand up on tiptoes and heels, Romberg is absent.   DIAGNOSTIC DATA (LABS, IMAGING, TESTING) - I reviewed patient records, labs, notes, testing and imaging myself where available.   ASSESSMENT AND PLAN  Jose Logan is a 46 y.o. male   Chronic low back pain, status bilateral L4-5 decompression, Persistent bilateral lower extremity paresthesia  Length dependent sensory changes, hyperreflexia on examinations,  Differentiation diagnosis including peripheral neuropathy, need to rule out cervical spondylitic myelopathy  MRI of cervical spine  EMG nerve conduction study  Laboratory evaluations  Skin biopsy to rule out small fiber neuropathy   Levert FeinsteinYijun Anzal Bartnick, M.D. Ph.D.  [facility] [address], [address] [place], [place] [address] Ph: [phone] Fax: [phone]  ZH:YQMVHCC:Beane, Tinnie GensJeffrey, MD

## 2017-01-29 ENCOUNTER — Encounter: Payer: Self-pay | Admitting: Neurology

## 2017-01-29 ENCOUNTER — Ambulatory Visit: Payer: Managed Care, Other (non HMO) | Admitting: Neurology

## 2017-01-29 VITALS — BP 139/95 | HR 72 | Ht 69.0 in | Wt 204.0 lb

## 2017-01-29 DIAGNOSIS — R202 Paresthesia of skin: Secondary | ICD-10-CM | POA: Diagnosis not present

## 2017-01-29 DIAGNOSIS — E538 Deficiency of other specified B group vitamins: Secondary | ICD-10-CM | POA: Insufficient documentation

## 2017-01-29 LAB — COPPER, SERUM: COPPER: 111 ug/dL (ref 72–166)

## 2017-01-29 LAB — VITAMIN D 25 HYDROXY (VIT D DEFICIENCY, FRACTURES): VIT D 25 HYDROXY: 27.6 ng/mL — AB (ref 30.0–100.0)

## 2017-01-29 LAB — IMMUNOFIXATION ELECTROPHORESIS
IgA/Immunoglobulin A, Serum: 156 mg/dL (ref 90–386)
IgG (Immunoglobin G), Serum: 1108 mg/dL (ref 700–1600)
IgM (Immunoglobulin M), Srm: 87 mg/dL (ref 20–172)
Total Protein: 7.8 g/dL (ref 6.0–8.5)

## 2017-01-29 LAB — VITAMIN B12: VITAMIN B 12: 211 pg/mL — AB (ref 232–1245)

## 2017-01-29 LAB — HGB A1C W/O EAG: HEMOGLOBIN A1C: 5.7 % — AB (ref 4.8–5.6)

## 2017-01-29 LAB — ANA W/REFLEX: ANA: NEGATIVE

## 2017-01-29 LAB — RPR: RPR: NONREACTIVE

## 2017-01-29 LAB — C-REACTIVE PROTEIN: CRP: 2.8 mg/L (ref 0.0–4.9)

## 2017-01-29 LAB — HIV ANTIBODY (ROUTINE TESTING W REFLEX): HIV Screen 4th Generation wRfx: NONREACTIVE

## 2017-01-29 LAB — SEDIMENTATION RATE: SED RATE: 2 mm/h (ref 0–15)

## 2017-01-29 NOTE — Progress Notes (Signed)
Patient was in left lateral recombinant position. Sterile technique. 1% lidocaine with epinephrine was used for local anesthesia. Punctuated skin biopsy was performed. 3 mm skin sample were obtained at the right foot, above right extensor digitorum brevis, and right lateral calf, 10 cm above lateral malleolus, lateral thigh, 20 cm below superior iliac spine.  Patient tolerated the procedure well.  The wound was covered with neosporin antibiotic cream and bandage.

## 2017-01-31 ENCOUNTER — Telehealth: Payer: Self-pay | Admitting: Neurology

## 2017-01-31 ENCOUNTER — Encounter: Payer: Managed Care, Other (non HMO) | Admitting: Neurology

## 2017-01-31 ENCOUNTER — Ambulatory Visit (INDEPENDENT_AMBULATORY_CARE_PROVIDER_SITE_OTHER): Payer: Managed Care, Other (non HMO) | Admitting: Neurology

## 2017-01-31 DIAGNOSIS — R202 Paresthesia of skin: Secondary | ICD-10-CM

## 2017-01-31 DIAGNOSIS — M545 Low back pain, unspecified: Secondary | ICD-10-CM

## 2017-01-31 DIAGNOSIS — Z0289 Encounter for other administrative examinations: Secondary | ICD-10-CM

## 2017-01-31 DIAGNOSIS — R269 Unspecified abnormalities of gait and mobility: Secondary | ICD-10-CM

## 2017-01-31 NOTE — Telephone Encounter (Signed)
Please call patient, repeat laboratory evaluation showed vitamin B12 deficiency 216, elevated homocystine level,  He would benefit vitamin B12 IM supplement, 1000 units daily for 1 week, and weekly for 1 month, then monthly, this can be done by his primary care office

## 2017-01-31 NOTE — Telephone Encounter (Signed)
Left patient a detailed message, with results and recommended injections, on voicemail at home and cell (ok per DPR).  [place] in [place] and his PCP, Dr. Donzetta Sprunganiel Terry, is also in [place].  His lab results and Dr. Zannie CoveYan's ordered injection schedule has been faxed and confirmed to patient's PCP office (so he can have injections close to his home).  The patient is aware to expect a call from them for scheduling. Provided our number to call back with any questions.

## 2017-01-31 NOTE — Procedures (Signed)
Full Name: Jose Logan Gender: Male MRN #: [id number] Date of Birth: 07/20/1970    Visit Date: 01/31/2017 09:10 Age: [age] Years 3 Months Old Examining Physician: Levert FeinsteinYijun Kora Groom, MD  Referring Physician: Terrace ArabiaYan, MD History: 46 year old male, with more than a year history of progressive bilateral feet paresthesia and pain,   Summary of the test: Nerve conduction study: Bilateral sural, superficial peroneal sensory responses were normal.  Right median, radial sensory responses were normal.  Bilateral tibial, peroneal motor responses were normal.  Right median and ulnar motor responses were normal.  Electromyography: Selective needle examination of bilateral lower extremity muscles bilateral lumbosacral paraspinals were normal.  Conclusion: This is a normal study.  There is no electrodiagnostic evidence of large fiber peripheral neuropathy or lumbosacral radiculopathy.    ------------------------------- Levert FeinsteinYIjun Sakai Heinle, M.D.  [facility] [address] [place], [place] [address] Tel: [phone] Fax: [phone]        [facility]    Nerve / Sites Muscle Latency Ref. Amplitude Ref. Rel Amp Segments Distance Velocity Ref. Area    ms ms mV mV %  cm m/s m/s mVms  R Median - APB     Wrist APB 3.9 ?4.4 9.7 ?4.0 100 Wrist - APB 7   41.9     Upper arm APB 8.3  9.0  93.6 Upper arm - Wrist 25 56 ?49 40.1  R Ulnar - ADM     Wrist ADM 2.7 ?3.3 14.3 ?6.0 100 Wrist - ADM 7   44.6     B.Elbow ADM 6.6  12.7  89 B.Elbow - Wrist 21 54 ?49 42.3     A.Elbow ADM 8.3  12.2  96.1 A.Elbow - B.Elbow 10 58 ?49 40.3         A.Elbow - Wrist      R Peroneal - EDB     Ankle EDB 5.3 ?6.5 7.5 ?2.0 100 Ankle - EDB 9   21.6     Fib head EDB 12.9  7.2  96 Fib head - Ankle 35 46 ?44 21.6     Pop fossa EDB 15.2  7.5  105 Pop fossa - Fib head 10 45 ?44 23.1         Pop fossa - Ankle      L Peroneal - EDB     Ankle EDB 5.8 ?6.5 5.7 ?2.0 100 Ankle - EDB 9   18.7     Fib head EDB 14.1  5.2  90.8 Fib  head - Ankle 36 44 ?44 18.9     Pop fossa EDB 16.4  5.6  108 Pop fossa - Fib head 10 44 ?44 18.7         Pop fossa - Ankle      R Tibial - AH     Ankle AH 5.8 ?5.8 10.5 ?4.0 100 Ankle - AH 9   31.6     Pop fossa AH 15.5  7.5  71.8 Pop fossa - Ankle 40 41 ?41 30.4  L Tibial - AH     Ankle AH 5.8 ?5.8 5.7 ?4.0 100 Ankle - AH 9   17.8     Pop fossa AH 15.7  3.9  67 Pop fossa - Ankle 40 41 ?41 15.0                 SNC    Nerve / Sites Rec. Site Peak Lat Ref.  Amp Ref. Segments Distance Peak Diff Ref.    ms  ms V V  cm ms ms  R Radial - Anatomical snuff box (Forearm)     Forearm Wrist 2.4 ?2.9 20 ?15 Forearm - Wrist 10    R Sural - Ankle (Calf)     Calf Ankle 4.1 ?4.4 15 ?6 Calf - Ankle 14    L Sural - Ankle (Calf)     Calf Ankle 4.0 ?4.4 14 ?6 Calf - Ankle 14    L Superficial peroneal - Ankle     Lat leg Ankle 4.3 ?4.4 7 ?6 Lat leg - Ankle 14    R Superficial peroneal - Ankle     Lat leg Ankle 4.4 ?4.4 9 ?6 Lat leg - Ankle 14    R Median, Ulnar - Transcarpal comparison     Median Palm Wrist 2.2 ?2.2 80 ?35 Median Palm - Wrist 8       Ulnar Palm Wrist 2.1 ?2.2 14 ?12 Ulnar Palm - Wrist 8          Median Palm - Ulnar Palm  0.1 ?0.4  R Median - Orthodromic (Dig II, Mid palm)     Dig II Wrist 3.2 ?3.4 19 ?10 Dig II - Wrist 4913                     F  Wave    Nerve F Lat Ref.   ms ms  R Ulnar - ADM 29.5 ?32.0  R Tibial - AH 55.6 ?56.0  L Tibial - AH 55.6 ?56.0           EMG full       EMG Summary Table    Spontaneous MUAP Recruitment  Muscle IA Fib PSW Fasc Other Amp Dur. Poly Pattern  L. Tibialis anterior Normal None None None _______ Normal Normal Normal Normal  L. Gastrocnemius (Medial head) Normal None None None _______ Normal Normal Normal Normal  L. Vastus lateralis Normal None None None _______ Normal Normal Normal Normal  R. Tibialis anterior Normal None None None _______ Normal Normal Normal Normal  R. Tibialis posterior Normal None None None _______ Normal Normal  Normal Normal  R. Vastus lateralis Normal None None None _______ Normal Normal Normal Normal  R. Lumbar paraspinals (mid) Normal None None None _______ Normal Normal Normal Normal  R. Lumbar paraspinals (low) Normal None None None _______ Normal Normal Normal Normal  L. Lumbar paraspinals (mid) Normal None None None _______ Normal Normal Normal Normal  L. Lumbar paraspinals (low) Normal None None None _______ Normal Normal Normal Normal

## 2017-02-01 LAB — FOLATE: FOLATE: 11.6 ng/mL (ref >3.0)

## 2017-02-01 LAB — HOMOCYSTEINE: Homocysteine: 18 umol/L — ABNORMAL HIGH (ref 0.0–15.0)

## 2017-02-01 LAB — VITAMIN B12: VITAMIN B 12: 216 pg/mL — AB (ref 232–1245)

## 2017-02-01 LAB — METHYLMALONIC ACID, SERUM: Methylmalonic Acid: 229 nmol/L (ref 0–378)

## 2017-02-07 ENCOUNTER — Ambulatory Visit
Admission: RE | Admit: 2017-02-07 | Discharge: 2017-02-07 | Disposition: A | Discharge status: Home / Self Care | Payer: Managed Care, Other (non HMO) | Source: Ambulatory Visit | Attending: Neurology | Admitting: Neurology

## 2017-02-07 ENCOUNTER — Telehealth: Payer: Self-pay | Admitting: *Deleted

## 2017-02-07 DIAGNOSIS — M545 Low back pain, unspecified: Secondary | ICD-10-CM

## 2017-02-07 DIAGNOSIS — R202 Paresthesia of skin: Secondary | ICD-10-CM | POA: Diagnosis not present

## 2017-02-07 DIAGNOSIS — R269 Unspecified abnormalities of gait and mobility: Secondary | ICD-10-CM

## 2017-02-07 NOTE — Telephone Encounter (Signed)
Skin Biopsy completed on 01/29/17:  Diagnosis:  A. Rt Thigh:  Skin with normal Epidermal Nerve fiber Density. B. Rt Calf:  Skin with significantly reduced Epidermal Nerve fiber Density, consistent with small fiber neuropathy. C. Rt Foot: Skin with significantly reduced Epidermal Nerve fiber Density, consistent with small fiber neuropathy.

## 2017-02-07 NOTE — Telephone Encounter (Signed)
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

## 2017-02-07 NOTE — Telephone Encounter (Signed)
Left patient a detailed message, with results, on voicemail (ok per DPR).  Provided our number to call back with any questions.  

## 2017-02-09 ENCOUNTER — Telehealth: Payer: Self-pay | Admitting: *Deleted

## 2017-02-09 NOTE — Telephone Encounter (Signed)
Left patient a detailed message, with results, on voicemail (ok per DPR).  Provided our number to call back with any questions.  

## 2017-02-09 NOTE — Telephone Encounter (Signed)
-----   Message from Levert FeinsteinYijun Yan, MD sent at 02/09/2017  9:22 AM EST ----- Please call pt: MRI of cervical spine showed no significant abnormality

## 2017-03-15 ENCOUNTER — Encounter: Payer: Self-pay | Admitting: Neurology

## 2017-03-15 ENCOUNTER — Ambulatory Visit: Payer: Managed Care, Other (non HMO) | Admitting: Neurology

## 2017-03-15 VITALS — BP 138/94 | HR 66 | Ht 69.0 in | Wt 205.5 lb

## 2017-03-15 DIAGNOSIS — E538 Deficiency of other specified B group vitamins: Secondary | ICD-10-CM | POA: Diagnosis not present

## 2017-03-15 DIAGNOSIS — R202 Paresthesia of skin: Secondary | ICD-10-CM

## 2017-03-15 DIAGNOSIS — M48061 Spinal stenosis, lumbar region without neurogenic claudication: Secondary | ICD-10-CM | POA: Diagnosis not present

## 2017-03-15 MED ORDER — DULOXETINE HCL 60 MG PO CPEP: 60.0000 mg | ORAL_CAPSULE | Freq: Every day | ORAL | 12 refills | Status: DC

## 2017-03-15 NOTE — Progress Notes (Signed)
PATIENT: Jose Logan DOB: August 25, 1970  Chief Complaint  Patient presents with  . Numbness    He is here with his wife, Jose Logan.  They would like to discuss his MRI, NCV/EMG and skin biopsy results.  He was taking nortriptyline 43m at bedtime but did not get it refilled after the first month because it was not helpful.      HISTORICAL  Jose Ida47year old male, seen in refer by orthopedic surgeon Dr. BSusa Dayfor evaluation of bilateral lower extremity paresthesia, initial evaluation was on January 25, 2017  I reviewed and summarized the referring note, he works at a[employer], which require heavy lifting, wearing heavy boots, walking constantly, since 2016, he noticed right toe numbness, shortly afterwards, did become bilateral feet numbness tingling needle prick pain, mainly involving plantar surface, bilateral toes, also complains of worsening chronic low back pain, especially since 2017, constant, 8 out of 10 with midline low back pain, but he denies radiating pain, no bowel or bladder incontinence, worsening bilateral feet paresthesia after bearing weight, he denies upper extremity paresthesia,  He was seen by orthopedic surgeon Dr. BMaxie Better MRI of lumbar spine in May 2018 showed ligamentum flavum thickening with disc bulging at L4-5, cause moderate canal stenosis, bilateral subarticular recess narrowing with potential compression of bilateral L5 nerve roots,  He had microlumbar decompression bilaterally at L4-5 with separate fascial incision, bilateral foraminotomies of on September 14, 2016 by Dr. 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  EMG nerve conduction study showed no large fiber peripheral neuropathy  Skin biopsy in December 2018 consistent with small fiber neuropathy, length dependent patterns  REVIEW OF SYSTEMS: Full 14 system review of systems performed and notable only for: Activity change, cold intolerance, insomnia, back pain, walking difficulty numbness  ALLERGIES: Allergies  Allergen Reactions  . Tetracyclines & Related Rash    HOME MEDICATIONS: Current Outpatient Medications  Medication Sig Dispense Refill  . losartan-hydrochlorothiazide (HYZAAR) 100-12.5 MG tablet Take 1 tablet by mouth daily. Resume day after surgery     No current facility-administered medications for this visit.     PAST MEDICAL HISTORY: Past Medical History:  Diagnosis Date  . Hypertension   . Lumbar pain   . Numbness    top of right foot and left toes, dr beane aware for last year    PAST SURGICAL HISTORY: Past Surgical History:  Procedure Laterality Date  . hemorroid surgery and colonscopy    . LUMBAR LAMINECTOMY/DECOMPRESSION MICRODISCECTOMY N/A 09/14/2016   Procedure: Bilateral Decompression L4-5;  Surgeon: BTonita Cong  Dellis Filbert, MD;  Location: [facility];  Service: Orthopedics;  Laterality: N/A;  120 mins     FAMILY HISTORY: History reviewed. No pertinent family history.  SOCIAL HISTORY:  Social History   Socioeconomic History  . Marital status: Married    Spouse name: Not on file  . Number of children: Not on file  . Years of education: Not on file  . Highest education level: Not on file  Social Needs  . Financial resource strain: Not on file  . Food insecurity - worry: Not on file  . Food insecurity - inability: Not on file  . Transportation needs - medical: Not on file  . Transportation needs - non-medical: Not on file  Occupational History  . Not on file  Tobacco Use  . Smoking status: Never Smoker  . Smokeless tobacco: Current User  Substance and Sexual Activity  . Alcohol use: Yes    Comment: 3-4 beers per day  . Drug use: No  . Sexual activity: Not on file  Other Topics Concern  . Not on file  Social History Narrative  . Not on file     PHYSICAL EXAM   Vitals:   03/15/17 0850  BP: (!) 138/94  Pulse: 66  Weight: 205 lb 8 oz (93.2 kg)  Height: 5' 9"  (1.753 m)    Not recorded      Body mass index is 30.35 kg/m.  PHYSICAL EXAMNIATION:  Gen: NAD, conversant, well nourised, obese, well groomed                     Cardiovascular: Regular rate rhythm, no peripheral edema, warm, nontender. Eyes: Conjunctivae clear without exudates or hemorrhage Neck: Supple, no carotid bruits. Pulmonary: Clear to auscultation bilaterally   NEUROLOGICAL EXAM:  MENTAL STATUS: Speech:    Speech is normal; fluent and spontaneous with normal comprehension.  Cognition:     Orientation to time, place and person     Normal recent and remote memory     Normal Attention span and concentration     Normal Language, naming, repeating,spontaneous speech     Fund of knowledge   CRANIAL NERVES: CN II: Visual fields are full to confrontation. Fundoscopic exam is normal with sharp discs and no vascular changes. Pupils are round equal and briskly reactive to light. CN III, IV,  VI: extraocular movement are normal. No ptosis. CN V: Facial sensation is intact to pinprick in all 3 divisions bilaterally. Corneal responses are intact.  CN VII: Face is symmetric with normal eye closure and smile. CN VIII: Hearing is normal to rubbing fingers CN IX, X: Palate elevates symmetrically. Phonation is normal. CN XI: Head turning and shoulder shrug are intact CN XII: Tongue is midline with normal movements and no atrophy.  MOTOR: There is no pronator drift of out-stretched arms. Muscle bulk and tone are normal. Muscle strength is normal.  REFLEXES: Reflexes are 2+ and symmetric at the biceps, triceps, knees, and ankles. Plantar responses are flexor.  SENSORY: Lens dependent decreased to light touch, pinprick and vibratory sensation toes  COORDINATION: Rapid alternating movements and fine finger movements are intact. There is no dysmetria on finger-to-nose and heel-knee-shin.    GAIT/STANCE: Gait antalgic, able to stand up on tiptoes and heels, Romberg is absent.   DIAGNOSTIC DATA (LABS, IMAGING, TESTING) - I reviewed patient records, labs, notes, testing and imaging myself where available.   ASSESSMENT AND PLAN  Nakul Avino is a 47 y.o. 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 Ph.D.  [facility] [address], [place],  [address] Ph: [phone] Fax: [phone]  IZ:WRBBY, Dellis Filbert, MD

## 2017-05-07 ENCOUNTER — Other Ambulatory Visit: Payer: Self-pay | Admitting: *Deleted

## 2017-05-07 MED ORDER — DULOXETINE HCL 60 MG PO CPEP: 60.0000 mg | ORAL_CAPSULE | Freq: Every day | ORAL | 3 refills | Status: DC

## 2017-05-28 DIAGNOSIS — Z0271 Encounter for disability determination: Secondary | ICD-10-CM

## 2017-08-02 ENCOUNTER — Other Ambulatory Visit: Payer: Self-pay | Admitting: Neurology

## 2017-09-06 IMAGING — DX DG SPINE 1V PORT
1 series · 1 of 1 positions shown · non-contrast
Comparison: Film from earlier in the same day

CLINICAL DATA: Intraoperative localization

EXAM:
PORTABLE SPINE - 1 VIEW

[l-spine lat]
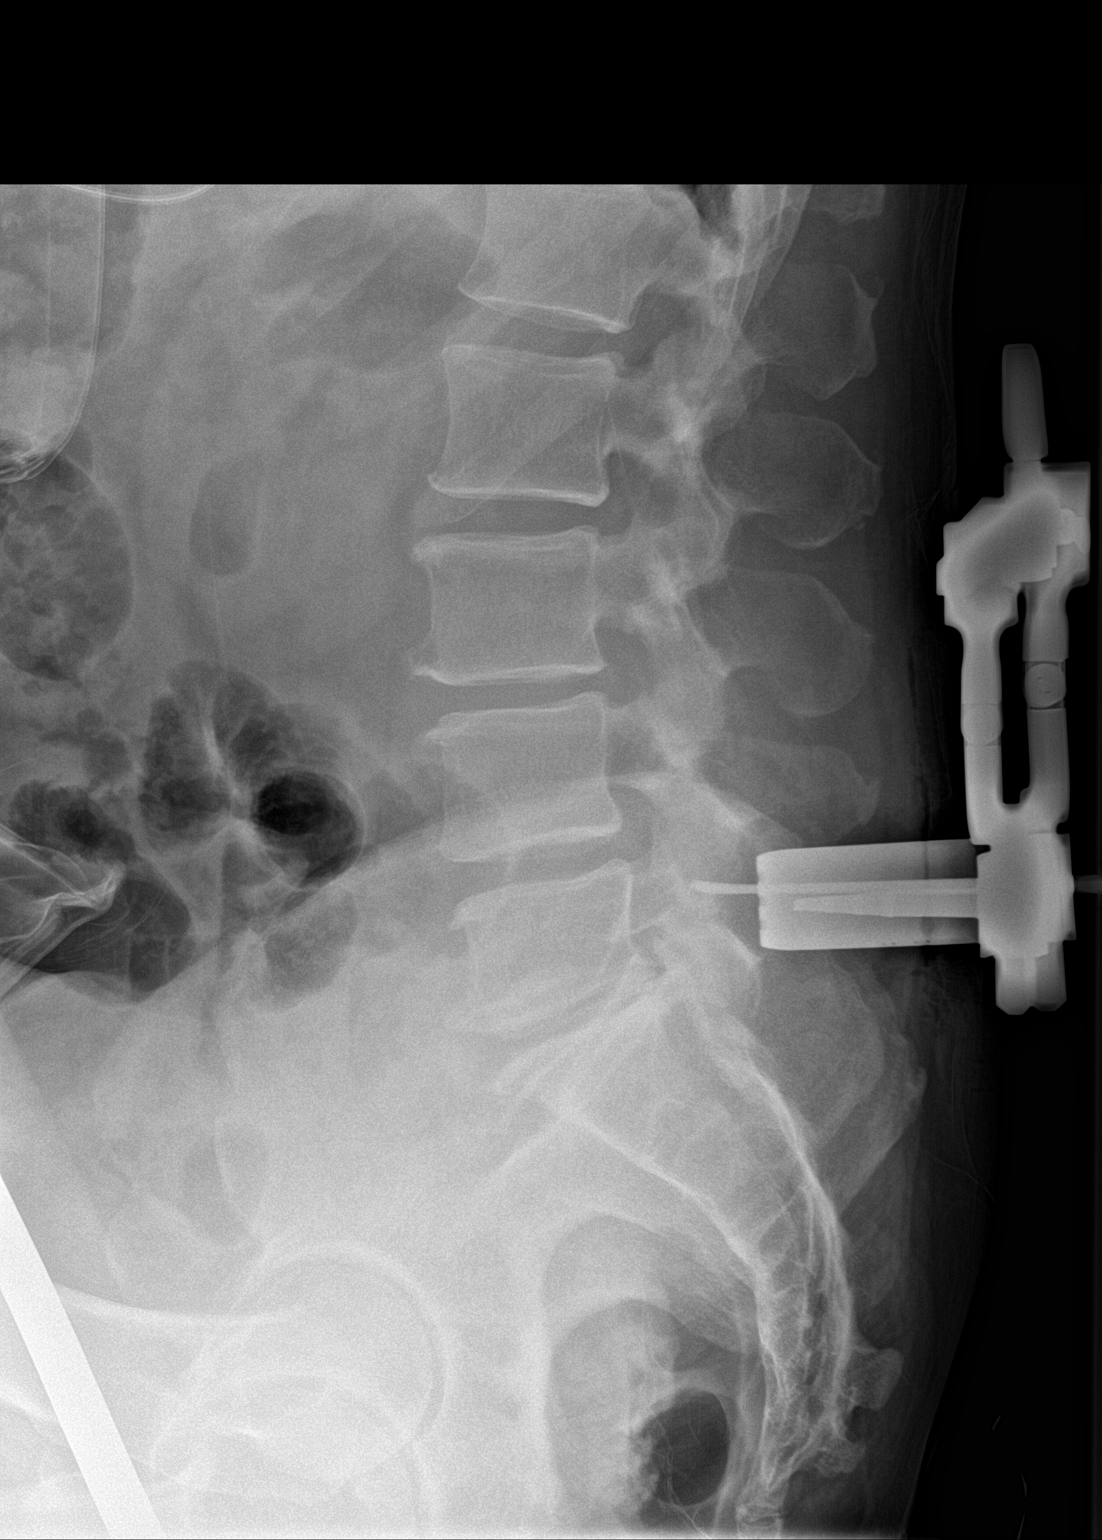

[1 of 1 positions shown; findings below may reference images not displayed]

FINDINGS: The numbering nomenclature utilized is similar to that seen on the
prior exam. Surgical retractors in instruments are noted at the L5
level.
IMPRESSION: Intraoperative localization at L5.

## 2018-05-15 DIAGNOSIS — G894 Chronic pain syndrome: Secondary | ICD-10-CM | POA: Diagnosis not present

## 2018-05-15 DIAGNOSIS — Z79891 Long term (current) use of opiate analgesic: Secondary | ICD-10-CM | POA: Diagnosis not present

## 2018-05-15 DIAGNOSIS — G629 Polyneuropathy, unspecified: Secondary | ICD-10-CM | POA: Diagnosis not present

## 2018-05-15 DIAGNOSIS — Z79899 Other long term (current) drug therapy: Secondary | ICD-10-CM | POA: Diagnosis not present

## 2018-05-15 DIAGNOSIS — M47816 Spondylosis without myelopathy or radiculopathy, lumbar region: Secondary | ICD-10-CM | POA: Diagnosis not present

## 2018-05-15 DIAGNOSIS — M79673 Pain in unspecified foot: Secondary | ICD-10-CM | POA: Diagnosis not present

## 2018-05-30 DIAGNOSIS — E782 Mixed hyperlipidemia: Secondary | ICD-10-CM | POA: Diagnosis not present

## 2018-05-30 DIAGNOSIS — K219 Gastro-esophageal reflux disease without esophagitis: Secondary | ICD-10-CM | POA: Diagnosis not present

## 2018-05-30 DIAGNOSIS — I10 Essential (primary) hypertension: Secondary | ICD-10-CM | POA: Diagnosis not present

## 2018-06-05 DIAGNOSIS — F331 Major depressive disorder, recurrent, moderate: Secondary | ICD-10-CM | POA: Diagnosis not present

## 2018-06-05 DIAGNOSIS — I10 Essential (primary) hypertension: Secondary | ICD-10-CM | POA: Diagnosis not present

## 2018-06-05 DIAGNOSIS — E782 Mixed hyperlipidemia: Secondary | ICD-10-CM | POA: Diagnosis not present

## 2018-06-05 DIAGNOSIS — M5136 Other intervertebral disc degeneration, lumbar region: Secondary | ICD-10-CM | POA: Diagnosis not present

## 2018-06-27 DIAGNOSIS — Z79891 Long term (current) use of opiate analgesic: Secondary | ICD-10-CM | POA: Diagnosis not present

## 2018-06-27 DIAGNOSIS — G629 Polyneuropathy, unspecified: Secondary | ICD-10-CM | POA: Diagnosis not present

## 2018-06-27 DIAGNOSIS — M79673 Pain in unspecified foot: Secondary | ICD-10-CM | POA: Diagnosis not present

## 2018-06-27 DIAGNOSIS — Z79899 Other long term (current) drug therapy: Secondary | ICD-10-CM | POA: Diagnosis not present

## 2018-06-27 DIAGNOSIS — G894 Chronic pain syndrome: Secondary | ICD-10-CM | POA: Diagnosis not present

## 2018-06-27 DIAGNOSIS — M47816 Spondylosis without myelopathy or radiculopathy, lumbar region: Secondary | ICD-10-CM | POA: Diagnosis not present

## 2018-07-02 DIAGNOSIS — M79609 Pain in unspecified limb: Secondary | ICD-10-CM | POA: Diagnosis not present

## 2018-07-02 DIAGNOSIS — R200 Anesthesia of skin: Secondary | ICD-10-CM | POA: Diagnosis not present

## 2018-07-11 DIAGNOSIS — M47817 Spondylosis without myelopathy or radiculopathy, lumbosacral region: Secondary | ICD-10-CM | POA: Diagnosis not present

## 2018-07-11 DIAGNOSIS — M545 Low back pain: Secondary | ICD-10-CM | POA: Diagnosis not present

## 2018-07-15 DIAGNOSIS — M545 Low back pain: Secondary | ICD-10-CM | POA: Diagnosis not present

## 2018-07-15 DIAGNOSIS — M47817 Spondylosis without myelopathy or radiculopathy, lumbosacral region: Secondary | ICD-10-CM | POA: Diagnosis not present

## 2018-07-23 DIAGNOSIS — M545 Low back pain: Secondary | ICD-10-CM | POA: Diagnosis not present

## 2018-07-23 DIAGNOSIS — M47817 Spondylosis without myelopathy or radiculopathy, lumbosacral region: Secondary | ICD-10-CM | POA: Diagnosis not present

## 2018-07-25 DIAGNOSIS — G629 Polyneuropathy, unspecified: Secondary | ICD-10-CM | POA: Diagnosis not present

## 2018-07-25 DIAGNOSIS — G894 Chronic pain syndrome: Secondary | ICD-10-CM | POA: Diagnosis not present

## 2018-07-25 DIAGNOSIS — M79673 Pain in unspecified foot: Secondary | ICD-10-CM | POA: Diagnosis not present

## 2018-07-25 DIAGNOSIS — M47816 Spondylosis without myelopathy or radiculopathy, lumbar region: Secondary | ICD-10-CM | POA: Diagnosis not present

## 2018-08-01 DIAGNOSIS — M545 Low back pain: Secondary | ICD-10-CM | POA: Diagnosis not present

## 2018-08-01 DIAGNOSIS — M47817 Spondylosis without myelopathy or radiculopathy, lumbosacral region: Secondary | ICD-10-CM | POA: Diagnosis not present

## 2018-09-02 DIAGNOSIS — M961 Postlaminectomy syndrome, not elsewhere classified: Secondary | ICD-10-CM | POA: Diagnosis not present

## 2018-09-02 DIAGNOSIS — M79673 Pain in unspecified foot: Secondary | ICD-10-CM | POA: Diagnosis not present

## 2018-09-02 DIAGNOSIS — Z79899 Other long term (current) drug therapy: Secondary | ICD-10-CM | POA: Diagnosis not present

## 2018-09-02 DIAGNOSIS — G894 Chronic pain syndrome: Secondary | ICD-10-CM | POA: Diagnosis not present

## 2018-09-02 DIAGNOSIS — G629 Polyneuropathy, unspecified: Secondary | ICD-10-CM | POA: Diagnosis not present

## 2018-09-02 DIAGNOSIS — Z79891 Long term (current) use of opiate analgesic: Secondary | ICD-10-CM | POA: Diagnosis not present

## 2018-10-08 DIAGNOSIS — E782 Mixed hyperlipidemia: Secondary | ICD-10-CM | POA: Diagnosis not present

## 2018-10-08 DIAGNOSIS — F331 Major depressive disorder, recurrent, moderate: Secondary | ICD-10-CM | POA: Diagnosis not present

## 2018-10-08 DIAGNOSIS — M5136 Other intervertebral disc degeneration, lumbar region: Secondary | ICD-10-CM | POA: Diagnosis not present

## 2018-10-08 DIAGNOSIS — I10 Essential (primary) hypertension: Secondary | ICD-10-CM | POA: Diagnosis not present

## 2018-11-20 DIAGNOSIS — M961 Postlaminectomy syndrome, not elsewhere classified: Secondary | ICD-10-CM | POA: Diagnosis not present

## 2018-11-20 DIAGNOSIS — Z79891 Long term (current) use of opiate analgesic: Secondary | ICD-10-CM | POA: Diagnosis not present

## 2018-11-20 DIAGNOSIS — M79673 Pain in unspecified foot: Secondary | ICD-10-CM | POA: Diagnosis not present

## 2018-11-20 DIAGNOSIS — G894 Chronic pain syndrome: Secondary | ICD-10-CM | POA: Diagnosis not present

## 2018-11-20 DIAGNOSIS — G629 Polyneuropathy, unspecified: Secondary | ICD-10-CM | POA: Diagnosis not present

## 2018-11-20 DIAGNOSIS — Z79899 Other long term (current) drug therapy: Secondary | ICD-10-CM | POA: Diagnosis not present

## 2018-12-25 DIAGNOSIS — G894 Chronic pain syndrome: Secondary | ICD-10-CM | POA: Diagnosis not present

## 2018-12-25 DIAGNOSIS — M79673 Pain in unspecified foot: Secondary | ICD-10-CM | POA: Diagnosis not present

## 2018-12-25 DIAGNOSIS — M47816 Spondylosis without myelopathy or radiculopathy, lumbar region: Secondary | ICD-10-CM | POA: Diagnosis not present

## 2018-12-25 DIAGNOSIS — Z79891 Long term (current) use of opiate analgesic: Secondary | ICD-10-CM | POA: Diagnosis not present

## 2018-12-25 DIAGNOSIS — Z79899 Other long term (current) drug therapy: Secondary | ICD-10-CM | POA: Diagnosis not present

## 2018-12-25 DIAGNOSIS — G629 Polyneuropathy, unspecified: Secondary | ICD-10-CM | POA: Diagnosis not present

## 2019-01-22 DIAGNOSIS — M961 Postlaminectomy syndrome, not elsewhere classified: Secondary | ICD-10-CM | POA: Diagnosis not present

## 2019-02-19 DIAGNOSIS — Z79891 Long term (current) use of opiate analgesic: Secondary | ICD-10-CM | POA: Diagnosis not present

## 2019-02-19 DIAGNOSIS — G629 Polyneuropathy, unspecified: Secondary | ICD-10-CM | POA: Diagnosis not present

## 2019-02-19 DIAGNOSIS — M5416 Radiculopathy, lumbar region: Secondary | ICD-10-CM | POA: Diagnosis not present

## 2019-02-19 DIAGNOSIS — G894 Chronic pain syndrome: Secondary | ICD-10-CM | POA: Diagnosis not present

## 2019-02-19 DIAGNOSIS — Z79899 Other long term (current) drug therapy: Secondary | ICD-10-CM | POA: Diagnosis not present

## 2019-02-19 DIAGNOSIS — M47816 Spondylosis without myelopathy or radiculopathy, lumbar region: Secondary | ICD-10-CM | POA: Diagnosis not present

## 2019-03-06 DIAGNOSIS — Z0001 Encounter for general adult medical examination with abnormal findings: Secondary | ICD-10-CM | POA: Diagnosis not present

## 2019-03-11 DIAGNOSIS — I10 Essential (primary) hypertension: Secondary | ICD-10-CM | POA: Diagnosis not present

## 2019-03-11 DIAGNOSIS — F331 Major depressive disorder, recurrent, moderate: Secondary | ICD-10-CM | POA: Diagnosis not present

## 2019-03-11 DIAGNOSIS — E782 Mixed hyperlipidemia: Secondary | ICD-10-CM | POA: Diagnosis not present

## 2019-03-11 DIAGNOSIS — Z0001 Encounter for general adult medical examination with abnormal findings: Secondary | ICD-10-CM | POA: Diagnosis not present

## 2019-03-18 DIAGNOSIS — Z1212 Encounter for screening for malignant neoplasm of rectum: Secondary | ICD-10-CM | POA: Diagnosis not present

## 2019-03-18 DIAGNOSIS — Z1211 Encounter for screening for malignant neoplasm of colon: Secondary | ICD-10-CM | POA: Diagnosis not present

## 2019-03-26 DIAGNOSIS — M5416 Radiculopathy, lumbar region: Secondary | ICD-10-CM | POA: Diagnosis not present

## 2019-03-26 DIAGNOSIS — G894 Chronic pain syndrome: Secondary | ICD-10-CM | POA: Diagnosis not present

## 2019-03-26 DIAGNOSIS — M47816 Spondylosis without myelopathy or radiculopathy, lumbar region: Secondary | ICD-10-CM | POA: Diagnosis not present

## 2019-03-26 DIAGNOSIS — M79673 Pain in unspecified foot: Secondary | ICD-10-CM | POA: Diagnosis not present

## 2019-03-28 DIAGNOSIS — E7849 Other hyperlipidemia: Secondary | ICD-10-CM | POA: Diagnosis not present

## 2019-03-28 DIAGNOSIS — I10 Essential (primary) hypertension: Secondary | ICD-10-CM | POA: Diagnosis not present

## 2019-04-11 DIAGNOSIS — R195 Other fecal abnormalities: Secondary | ICD-10-CM | POA: Diagnosis not present

## 2019-04-21 DIAGNOSIS — Z01818 Encounter for other preprocedural examination: Secondary | ICD-10-CM | POA: Diagnosis not present

## 2019-04-23 DIAGNOSIS — K621 Rectal polyp: Secondary | ICD-10-CM | POA: Diagnosis not present

## 2019-04-23 DIAGNOSIS — K635 Polyp of colon: Secondary | ICD-10-CM | POA: Diagnosis not present

## 2019-04-23 DIAGNOSIS — R195 Other fecal abnormalities: Secondary | ICD-10-CM | POA: Diagnosis not present

## 2019-04-23 DIAGNOSIS — D123 Benign neoplasm of transverse colon: Secondary | ICD-10-CM | POA: Diagnosis not present

## 2019-04-23 DIAGNOSIS — I10 Essential (primary) hypertension: Secondary | ICD-10-CM | POA: Diagnosis not present

## 2019-04-23 DIAGNOSIS — G629 Polyneuropathy, unspecified: Secondary | ICD-10-CM | POA: Diagnosis not present

## 2019-04-23 DIAGNOSIS — K5140 Inflammatory polyps of colon without complications: Secondary | ICD-10-CM | POA: Diagnosis not present

## 2019-04-23 DIAGNOSIS — D126 Benign neoplasm of colon, unspecified: Secondary | ICD-10-CM | POA: Diagnosis not present

## 2019-04-23 DIAGNOSIS — D124 Benign neoplasm of descending colon: Secondary | ICD-10-CM | POA: Diagnosis not present

## 2019-04-23 DIAGNOSIS — K5730 Diverticulosis of large intestine without perforation or abscess without bleeding: Secondary | ICD-10-CM | POA: Diagnosis not present

## 2019-04-23 DIAGNOSIS — D128 Benign neoplasm of rectum: Secondary | ICD-10-CM | POA: Diagnosis not present

## 2019-04-23 DIAGNOSIS — Z881 Allergy status to other antibiotic agents status: Secondary | ICD-10-CM | POA: Diagnosis not present

## 2019-04-23 DIAGNOSIS — Z79899 Other long term (current) drug therapy: Secondary | ICD-10-CM | POA: Diagnosis not present

## 2019-04-23 DIAGNOSIS — E7800 Pure hypercholesterolemia, unspecified: Secondary | ICD-10-CM | POA: Diagnosis not present

## 2019-04-25 DIAGNOSIS — I10 Essential (primary) hypertension: Secondary | ICD-10-CM | POA: Diagnosis not present

## 2019-04-25 DIAGNOSIS — E7849 Other hyperlipidemia: Secondary | ICD-10-CM | POA: Diagnosis not present

## 2019-05-09 DIAGNOSIS — D125 Benign neoplasm of sigmoid colon: Secondary | ICD-10-CM | POA: Diagnosis not present

## 2019-05-09 DIAGNOSIS — K624 Stenosis of anus and rectum: Secondary | ICD-10-CM | POA: Diagnosis not present

## 2019-05-09 DIAGNOSIS — R195 Other fecal abnormalities: Secondary | ICD-10-CM | POA: Diagnosis not present

## 2019-05-09 NOTE — Progress Notes (Signed)
 Jose Logan 49 y.o. Jan 25, 1971 Phone: [phone] (home)  Address: [address] [place] [address]  MRN: [id number] Primary MD : Jerel KANDICE Sieving  [facility]   Problem List Items Addressed This Visit      Digestive   Adenomatous polyp of sigmoid colon - Primary   Hyperplastic polyp of descending colon   Anal stenosis     Other   Positive colorectal cancer screening using Cologuard test    Patient reports having anal sphincter pain for a week after the procedure denies any other complaints after the colonoscopy.  Due to his anal canal stenosis, an EGD scope had to be used and I was unable to pass the scope beyond the ascending colon.  I will refer him to colorectal for anal stenosis and incomplete colonoscopy.  We did discuss doing a virtual colonoscopy but I am unsure of the diameter of instrument that is used to insert the contrast.  The patient currently is very reluctant to have anything further done but is willing to go and see the colorectal surgeon.  Colonoscopy Follow Up  Past Medical History:  Diagnosis Date  . Hypertension   . Neuropathy    Past Surgical History:  Procedure Laterality Date  . BACK SURGERY    . COLONOSCOPY W/ POLYPECTOMY  04/23/2019   Allergies  Allergen Reactions  . Tetracyclines Rash    Meds:  Current Outpatient Medications:  .  DULoxetine  (CYMBALTA ) 20 MG capsule, Cymbalta , Disp: , Rfl:  .  ibuprofen (MOTRIN) 800 MG tablet, ibuprofen 800 mg tablet  TAKE 1 (ONE) TABLET BY MOUTH THREE TIMES DAILY WITH FOOD PRN, Disp: , Rfl:  .  NUCYNTA  75 mg tablet, , Disp: , Rfl:  .  pregabalin  (LYRICA ) 100 MG capsule, , Disp: , Rfl:  .  rosuvastatin  (CRESTOR ) 10 MG tablet, Take 10 mg by mouth daily., Disp: , Rfl:  .  traZODone (DESYREL) 50 MG tablet, Take 50 mg by mouth nightly., Disp: , Rfl:   SocHx:  reports that he has never smoked. He has never used smokeless tobacco. He reports that he does not drink alcohol  or use  drugs.  FamHx: has no family status information on file.    Special Needs: No special needs identified Subjective: Last Colonoscopy: 04/23/19  Complaint that prompted study: Positive cologuard  Conclusion: Hepatic flexure: Inflammatory polyp Normal colonic epithelium  Polyps at 50 cm: Hyperplastic polyp, three fragments  Polyps at 25 cm:  Tubular adenoma 0.3 cm  Hyperplastic polyp  Rectal polyp: Hyperplastic polyp  Reliability:Good   Complications:Unable to pass the EGD scope beyond the ascending colon.  Due to the anal canal stenosis, unable to use the pediatric colonoscope  Follow-up testing: Referring to colorectal surgery for anal stenosis and completion colonoscopy.  Review of Systems A 12 system review of systems was negative except as noted in HPI  Objective:  Temp 36.6 C (97.8 F)  General:  well appearing, alert and oriented x 4  Pulmonary: CTAB, no wheezes, rhonci, crackles  CV:   RRR, S1,S2, no murmurs, gallops,rubs  GI: soft, bowel sounds active, non-tender  Skin:   wwp

## 2019-05-28 DIAGNOSIS — F331 Major depressive disorder, recurrent, moderate: Secondary | ICD-10-CM | POA: Diagnosis not present

## 2019-05-28 DIAGNOSIS — I10 Essential (primary) hypertension: Secondary | ICD-10-CM | POA: Diagnosis not present

## 2019-06-25 DIAGNOSIS — Z6828 Body mass index (BMI) 28.0-28.9, adult: Secondary | ICD-10-CM | POA: Diagnosis not present

## 2019-06-25 DIAGNOSIS — Z23 Encounter for immunization: Secondary | ICD-10-CM | POA: Diagnosis not present

## 2019-06-25 DIAGNOSIS — L639 Alopecia areata, unspecified: Secondary | ICD-10-CM | POA: Diagnosis not present

## 2019-07-04 DIAGNOSIS — I10 Essential (primary) hypertension: Secondary | ICD-10-CM | POA: Diagnosis not present

## 2019-07-04 DIAGNOSIS — K219 Gastro-esophageal reflux disease without esophagitis: Secondary | ICD-10-CM | POA: Diagnosis not present

## 2019-07-04 DIAGNOSIS — E782 Mixed hyperlipidemia: Secondary | ICD-10-CM | POA: Diagnosis not present

## 2019-07-09 DIAGNOSIS — E782 Mixed hyperlipidemia: Secondary | ICD-10-CM | POA: Diagnosis not present

## 2019-07-09 DIAGNOSIS — F331 Major depressive disorder, recurrent, moderate: Secondary | ICD-10-CM | POA: Diagnosis not present

## 2019-07-09 DIAGNOSIS — M5136 Other intervertebral disc degeneration, lumbar region: Secondary | ICD-10-CM | POA: Diagnosis not present

## 2019-07-09 DIAGNOSIS — I10 Essential (primary) hypertension: Secondary | ICD-10-CM | POA: Diagnosis not present

## 2019-07-28 DIAGNOSIS — E7849 Other hyperlipidemia: Secondary | ICD-10-CM | POA: Diagnosis not present

## 2019-07-28 DIAGNOSIS — K219 Gastro-esophageal reflux disease without esophagitis: Secondary | ICD-10-CM | POA: Diagnosis not present

## 2019-07-28 DIAGNOSIS — F331 Major depressive disorder, recurrent, moderate: Secondary | ICD-10-CM | POA: Diagnosis not present

## 2019-07-28 DIAGNOSIS — I10 Essential (primary) hypertension: Secondary | ICD-10-CM | POA: Diagnosis not present

## 2019-10-01 DIAGNOSIS — E782 Mixed hyperlipidemia: Secondary | ICD-10-CM | POA: Diagnosis not present

## 2019-10-01 DIAGNOSIS — I10 Essential (primary) hypertension: Secondary | ICD-10-CM | POA: Diagnosis not present

## 2019-10-01 DIAGNOSIS — R5382 Chronic fatigue, unspecified: Secondary | ICD-10-CM | POA: Diagnosis not present

## 2019-10-01 DIAGNOSIS — K219 Gastro-esophageal reflux disease without esophagitis: Secondary | ICD-10-CM | POA: Diagnosis not present

## 2019-10-08 DIAGNOSIS — F331 Major depressive disorder, recurrent, moderate: Secondary | ICD-10-CM | POA: Diagnosis not present

## 2019-10-08 DIAGNOSIS — I10 Essential (primary) hypertension: Secondary | ICD-10-CM | POA: Diagnosis not present

## 2019-10-08 DIAGNOSIS — E782 Mixed hyperlipidemia: Secondary | ICD-10-CM | POA: Diagnosis not present

## 2019-10-08 DIAGNOSIS — M5136 Other intervertebral disc degeneration, lumbar region: Secondary | ICD-10-CM | POA: Diagnosis not present

## 2019-11-27 DIAGNOSIS — K219 Gastro-esophageal reflux disease without esophagitis: Secondary | ICD-10-CM | POA: Diagnosis not present

## 2019-11-27 DIAGNOSIS — F331 Major depressive disorder, recurrent, moderate: Secondary | ICD-10-CM | POA: Diagnosis not present

## 2019-11-27 DIAGNOSIS — I10 Essential (primary) hypertension: Secondary | ICD-10-CM | POA: Diagnosis not present

## 2019-11-27 DIAGNOSIS — E7849 Other hyperlipidemia: Secondary | ICD-10-CM | POA: Diagnosis not present

## 2020-01-01 DIAGNOSIS — R5382 Chronic fatigue, unspecified: Secondary | ICD-10-CM | POA: Diagnosis not present

## 2020-01-01 DIAGNOSIS — E782 Mixed hyperlipidemia: Secondary | ICD-10-CM | POA: Diagnosis not present

## 2020-01-01 DIAGNOSIS — K219 Gastro-esophageal reflux disease without esophagitis: Secondary | ICD-10-CM | POA: Diagnosis not present

## 2020-01-01 DIAGNOSIS — I10 Essential (primary) hypertension: Secondary | ICD-10-CM | POA: Diagnosis not present

## 2020-01-06 DIAGNOSIS — F331 Major depressive disorder, recurrent, moderate: Secondary | ICD-10-CM | POA: Diagnosis not present

## 2020-01-06 DIAGNOSIS — E782 Mixed hyperlipidemia: Secondary | ICD-10-CM | POA: Diagnosis not present

## 2020-01-06 DIAGNOSIS — M5136 Other intervertebral disc degeneration, lumbar region: Secondary | ICD-10-CM | POA: Diagnosis not present

## 2020-01-06 DIAGNOSIS — I10 Essential (primary) hypertension: Secondary | ICD-10-CM | POA: Diagnosis not present

## 2020-02-13 DIAGNOSIS — R202 Paresthesia of skin: Secondary | ICD-10-CM | POA: Diagnosis not present

## 2020-02-13 DIAGNOSIS — Z6829 Body mass index (BMI) 29.0-29.9, adult: Secondary | ICD-10-CM | POA: Diagnosis not present

## 2020-02-27 DIAGNOSIS — K219 Gastro-esophageal reflux disease without esophagitis: Secondary | ICD-10-CM | POA: Diagnosis not present

## 2020-02-27 DIAGNOSIS — F331 Major depressive disorder, recurrent, moderate: Secondary | ICD-10-CM | POA: Diagnosis not present

## 2020-02-27 DIAGNOSIS — E7849 Other hyperlipidemia: Secondary | ICD-10-CM | POA: Diagnosis not present

## 2020-02-27 DIAGNOSIS — I10 Essential (primary) hypertension: Secondary | ICD-10-CM | POA: Diagnosis not present

## 2020-03-23 DIAGNOSIS — R059 Cough, unspecified: Secondary | ICD-10-CM | POA: Diagnosis not present

## 2020-03-23 DIAGNOSIS — Z20828 Contact with and (suspected) exposure to other viral communicable diseases: Secondary | ICD-10-CM | POA: Diagnosis not present

## 2020-03-27 DIAGNOSIS — F331 Major depressive disorder, recurrent, moderate: Secondary | ICD-10-CM | POA: Diagnosis not present

## 2020-03-27 DIAGNOSIS — I10 Essential (primary) hypertension: Secondary | ICD-10-CM | POA: Diagnosis not present

## 2020-03-27 DIAGNOSIS — E7849 Other hyperlipidemia: Secondary | ICD-10-CM | POA: Diagnosis not present

## 2020-03-27 DIAGNOSIS — K219 Gastro-esophageal reflux disease without esophagitis: Secondary | ICD-10-CM | POA: Diagnosis not present

## 2020-04-09 DIAGNOSIS — R5382 Chronic fatigue, unspecified: Secondary | ICD-10-CM | POA: Diagnosis not present

## 2020-04-09 DIAGNOSIS — E782 Mixed hyperlipidemia: Secondary | ICD-10-CM | POA: Diagnosis not present

## 2020-04-09 DIAGNOSIS — I10 Essential (primary) hypertension: Secondary | ICD-10-CM | POA: Diagnosis not present

## 2020-04-09 DIAGNOSIS — K219 Gastro-esophageal reflux disease without esophagitis: Secondary | ICD-10-CM | POA: Diagnosis not present

## 2020-04-26 DIAGNOSIS — E7849 Other hyperlipidemia: Secondary | ICD-10-CM | POA: Diagnosis not present

## 2020-04-26 DIAGNOSIS — F331 Major depressive disorder, recurrent, moderate: Secondary | ICD-10-CM | POA: Diagnosis not present

## 2020-04-26 DIAGNOSIS — I10 Essential (primary) hypertension: Secondary | ICD-10-CM | POA: Diagnosis not present

## 2020-04-26 DIAGNOSIS — K219 Gastro-esophageal reflux disease without esophagitis: Secondary | ICD-10-CM | POA: Diagnosis not present

## 2020-06-26 DIAGNOSIS — I10 Essential (primary) hypertension: Secondary | ICD-10-CM | POA: Diagnosis not present

## 2020-06-26 DIAGNOSIS — E7849 Other hyperlipidemia: Secondary | ICD-10-CM | POA: Diagnosis not present

## 2020-06-26 DIAGNOSIS — F331 Major depressive disorder, recurrent, moderate: Secondary | ICD-10-CM | POA: Diagnosis not present

## 2020-06-26 DIAGNOSIS — K219 Gastro-esophageal reflux disease without esophagitis: Secondary | ICD-10-CM | POA: Diagnosis not present

## 2020-07-14 DIAGNOSIS — I10 Essential (primary) hypertension: Secondary | ICD-10-CM | POA: Diagnosis not present

## 2020-07-14 DIAGNOSIS — R5382 Chronic fatigue, unspecified: Secondary | ICD-10-CM | POA: Diagnosis not present

## 2020-07-14 DIAGNOSIS — E782 Mixed hyperlipidemia: Secondary | ICD-10-CM | POA: Diagnosis not present

## 2020-07-14 DIAGNOSIS — D519 Vitamin B12 deficiency anemia, unspecified: Secondary | ICD-10-CM | POA: Diagnosis not present

## 2020-07-19 DIAGNOSIS — F331 Major depressive disorder, recurrent, moderate: Secondary | ICD-10-CM | POA: Diagnosis not present

## 2020-07-19 DIAGNOSIS — E7849 Other hyperlipidemia: Secondary | ICD-10-CM | POA: Diagnosis not present

## 2020-07-19 DIAGNOSIS — Z0001 Encounter for general adult medical examination with abnormal findings: Secondary | ICD-10-CM | POA: Diagnosis not present

## 2020-07-19 DIAGNOSIS — I10 Essential (primary) hypertension: Secondary | ICD-10-CM | POA: Diagnosis not present

## 2020-08-26 DIAGNOSIS — F331 Major depressive disorder, recurrent, moderate: Secondary | ICD-10-CM | POA: Diagnosis not present

## 2020-08-26 DIAGNOSIS — K219 Gastro-esophageal reflux disease without esophagitis: Secondary | ICD-10-CM | POA: Diagnosis not present

## 2020-08-26 DIAGNOSIS — E7849 Other hyperlipidemia: Secondary | ICD-10-CM | POA: Diagnosis not present

## 2020-08-26 DIAGNOSIS — I10 Essential (primary) hypertension: Secondary | ICD-10-CM | POA: Diagnosis not present

## 2020-09-26 DIAGNOSIS — F331 Major depressive disorder, recurrent, moderate: Secondary | ICD-10-CM | POA: Diagnosis not present

## 2020-09-26 DIAGNOSIS — K219 Gastro-esophageal reflux disease without esophagitis: Secondary | ICD-10-CM | POA: Diagnosis not present

## 2020-09-26 DIAGNOSIS — E7849 Other hyperlipidemia: Secondary | ICD-10-CM | POA: Diagnosis not present

## 2020-09-26 DIAGNOSIS — I10 Essential (primary) hypertension: Secondary | ICD-10-CM | POA: Diagnosis not present

## 2020-10-15 DIAGNOSIS — K219 Gastro-esophageal reflux disease without esophagitis: Secondary | ICD-10-CM | POA: Diagnosis not present

## 2020-10-15 DIAGNOSIS — R5382 Chronic fatigue, unspecified: Secondary | ICD-10-CM | POA: Diagnosis not present

## 2020-10-15 DIAGNOSIS — E782 Mixed hyperlipidemia: Secondary | ICD-10-CM | POA: Diagnosis not present

## 2020-10-20 DIAGNOSIS — F32A Depression, unspecified: Secondary | ICD-10-CM | POA: Diagnosis not present

## 2020-10-20 DIAGNOSIS — Z0001 Encounter for general adult medical examination with abnormal findings: Secondary | ICD-10-CM | POA: Diagnosis not present

## 2020-10-20 DIAGNOSIS — E7849 Other hyperlipidemia: Secondary | ICD-10-CM | POA: Diagnosis not present

## 2020-10-20 DIAGNOSIS — I10 Essential (primary) hypertension: Secondary | ICD-10-CM | POA: Diagnosis not present

## 2020-10-22 DIAGNOSIS — F331 Major depressive disorder, recurrent, moderate: Secondary | ICD-10-CM | POA: Diagnosis not present

## 2020-10-22 DIAGNOSIS — M5136 Other intervertebral disc degeneration, lumbar region: Secondary | ICD-10-CM | POA: Diagnosis not present

## 2020-10-22 DIAGNOSIS — E7849 Other hyperlipidemia: Secondary | ICD-10-CM | POA: Diagnosis not present

## 2020-10-22 DIAGNOSIS — I10 Essential (primary) hypertension: Secondary | ICD-10-CM | POA: Diagnosis not present

## 2021-01-18 DIAGNOSIS — I10 Essential (primary) hypertension: Secondary | ICD-10-CM | POA: Diagnosis not present

## 2021-01-18 DIAGNOSIS — R5382 Chronic fatigue, unspecified: Secondary | ICD-10-CM | POA: Diagnosis not present

## 2021-01-18 DIAGNOSIS — K219 Gastro-esophageal reflux disease without esophagitis: Secondary | ICD-10-CM | POA: Diagnosis not present

## 2021-01-18 DIAGNOSIS — E782 Mixed hyperlipidemia: Secondary | ICD-10-CM | POA: Diagnosis not present

## 2021-01-21 DIAGNOSIS — M5136 Other intervertebral disc degeneration, lumbar region: Secondary | ICD-10-CM | POA: Diagnosis not present

## 2021-01-21 DIAGNOSIS — E7849 Other hyperlipidemia: Secondary | ICD-10-CM | POA: Diagnosis not present

## 2021-01-21 DIAGNOSIS — F331 Major depressive disorder, recurrent, moderate: Secondary | ICD-10-CM | POA: Diagnosis not present

## 2021-01-21 DIAGNOSIS — I10 Essential (primary) hypertension: Secondary | ICD-10-CM | POA: Diagnosis not present

## 2021-04-13 DIAGNOSIS — I10 Essential (primary) hypertension: Secondary | ICD-10-CM | POA: Diagnosis not present

## 2021-04-13 DIAGNOSIS — K219 Gastro-esophageal reflux disease without esophagitis: Secondary | ICD-10-CM | POA: Diagnosis not present

## 2021-04-13 DIAGNOSIS — E782 Mixed hyperlipidemia: Secondary | ICD-10-CM | POA: Diagnosis not present

## 2021-04-20 DIAGNOSIS — I10 Essential (primary) hypertension: Secondary | ICD-10-CM | POA: Diagnosis not present

## 2021-04-20 DIAGNOSIS — M5136 Other intervertebral disc degeneration, lumbar region: Secondary | ICD-10-CM | POA: Diagnosis not present

## 2021-04-20 DIAGNOSIS — E7849 Other hyperlipidemia: Secondary | ICD-10-CM | POA: Diagnosis not present

## 2021-04-20 DIAGNOSIS — F331 Major depressive disorder, recurrent, moderate: Secondary | ICD-10-CM | POA: Diagnosis not present

## 2021-05-31 DIAGNOSIS — Z6831 Body mass index (BMI) 31.0-31.9, adult: Secondary | ICD-10-CM | POA: Diagnosis not present

## 2021-05-31 DIAGNOSIS — Z20828 Contact with and (suspected) exposure to other viral communicable diseases: Secondary | ICD-10-CM | POA: Diagnosis not present

## 2021-05-31 DIAGNOSIS — J019 Acute sinusitis, unspecified: Secondary | ICD-10-CM | POA: Diagnosis not present

## 2021-05-31 DIAGNOSIS — R059 Cough, unspecified: Secondary | ICD-10-CM | POA: Diagnosis not present

## 2021-06-27 DIAGNOSIS — Z6830 Body mass index (BMI) 30.0-30.9, adult: Secondary | ICD-10-CM | POA: Diagnosis not present

## 2021-06-27 DIAGNOSIS — S70361A Insect bite (nonvenomous), right thigh, initial encounter: Secondary | ICD-10-CM | POA: Diagnosis not present

## 2021-07-20 DIAGNOSIS — Z1329 Encounter for screening for other suspected endocrine disorder: Secondary | ICD-10-CM | POA: Diagnosis not present

## 2021-07-20 DIAGNOSIS — E782 Mixed hyperlipidemia: Secondary | ICD-10-CM | POA: Diagnosis not present

## 2021-07-20 DIAGNOSIS — K219 Gastro-esophageal reflux disease without esophagitis: Secondary | ICD-10-CM | POA: Diagnosis not present

## 2021-07-26 DIAGNOSIS — I10 Essential (primary) hypertension: Secondary | ICD-10-CM | POA: Diagnosis not present

## 2021-07-26 DIAGNOSIS — M5136 Other intervertebral disc degeneration, lumbar region: Secondary | ICD-10-CM | POA: Diagnosis not present

## 2021-07-26 DIAGNOSIS — E7849 Other hyperlipidemia: Secondary | ICD-10-CM | POA: Diagnosis not present

## 2021-07-26 DIAGNOSIS — F331 Major depressive disorder, recurrent, moderate: Secondary | ICD-10-CM | POA: Diagnosis not present

## 2021-09-21 DIAGNOSIS — L259 Unspecified contact dermatitis, unspecified cause: Secondary | ICD-10-CM | POA: Diagnosis not present

## 2021-09-21 DIAGNOSIS — Z20828 Contact with and (suspected) exposure to other viral communicable diseases: Secondary | ICD-10-CM | POA: Diagnosis not present

## 2021-09-21 DIAGNOSIS — Z6830 Body mass index (BMI) 30.0-30.9, adult: Secondary | ICD-10-CM | POA: Diagnosis not present

## 2021-09-21 DIAGNOSIS — J019 Acute sinusitis, unspecified: Secondary | ICD-10-CM | POA: Diagnosis not present

## 2021-10-12 DIAGNOSIS — Z1329 Encounter for screening for other suspected endocrine disorder: Secondary | ICD-10-CM | POA: Diagnosis not present

## 2021-10-12 DIAGNOSIS — I10 Essential (primary) hypertension: Secondary | ICD-10-CM | POA: Diagnosis not present

## 2021-10-12 DIAGNOSIS — K219 Gastro-esophageal reflux disease without esophagitis: Secondary | ICD-10-CM | POA: Diagnosis not present

## 2021-10-12 DIAGNOSIS — E7849 Other hyperlipidemia: Secondary | ICD-10-CM | POA: Diagnosis not present

## 2021-10-19 DIAGNOSIS — Z6830 Body mass index (BMI) 30.0-30.9, adult: Secondary | ICD-10-CM | POA: Diagnosis not present

## 2021-10-19 DIAGNOSIS — L821 Other seborrheic keratosis: Secondary | ICD-10-CM | POA: Diagnosis not present

## 2021-10-19 DIAGNOSIS — L739 Follicular disorder, unspecified: Secondary | ICD-10-CM | POA: Diagnosis not present

## 2021-10-21 DIAGNOSIS — I10 Essential (primary) hypertension: Secondary | ICD-10-CM | POA: Diagnosis not present

## 2021-10-21 DIAGNOSIS — E7849 Other hyperlipidemia: Secondary | ICD-10-CM | POA: Diagnosis not present

## 2021-10-21 DIAGNOSIS — Z6830 Body mass index (BMI) 30.0-30.9, adult: Secondary | ICD-10-CM | POA: Diagnosis not present

## 2021-10-21 DIAGNOSIS — Z0001 Encounter for general adult medical examination with abnormal findings: Secondary | ICD-10-CM | POA: Diagnosis not present

## 2021-12-26 DIAGNOSIS — E7849 Other hyperlipidemia: Secondary | ICD-10-CM | POA: Diagnosis not present

## 2021-12-26 DIAGNOSIS — I10 Essential (primary) hypertension: Secondary | ICD-10-CM | POA: Diagnosis not present

## 2021-12-26 DIAGNOSIS — Z0001 Encounter for general adult medical examination with abnormal findings: Secondary | ICD-10-CM | POA: Diagnosis not present

## 2021-12-26 DIAGNOSIS — F331 Major depressive disorder, recurrent, moderate: Secondary | ICD-10-CM | POA: Diagnosis not present

## 2022-02-14 DIAGNOSIS — R030 Elevated blood-pressure reading, without diagnosis of hypertension: Secondary | ICD-10-CM | POA: Diagnosis not present

## 2022-02-14 DIAGNOSIS — Z6832 Body mass index (BMI) 32.0-32.9, adult: Secondary | ICD-10-CM | POA: Diagnosis not present

## 2022-02-14 DIAGNOSIS — B078 Other viral warts: Secondary | ICD-10-CM | POA: Diagnosis not present

## 2022-02-14 DIAGNOSIS — D485 Neoplasm of uncertain behavior of skin: Secondary | ICD-10-CM | POA: Diagnosis not present

## 2022-04-03 DIAGNOSIS — K219 Gastro-esophageal reflux disease without esophagitis: Secondary | ICD-10-CM | POA: Diagnosis not present

## 2022-04-03 DIAGNOSIS — E7849 Other hyperlipidemia: Secondary | ICD-10-CM | POA: Diagnosis not present

## 2022-04-03 DIAGNOSIS — R739 Hyperglycemia, unspecified: Secondary | ICD-10-CM | POA: Diagnosis not present

## 2022-04-06 DIAGNOSIS — R4582 Worries: Secondary | ICD-10-CM | POA: Diagnosis not present

## 2022-04-06 DIAGNOSIS — E7849 Other hyperlipidemia: Secondary | ICD-10-CM | POA: Diagnosis not present

## 2022-04-06 DIAGNOSIS — I10 Essential (primary) hypertension: Secondary | ICD-10-CM | POA: Diagnosis not present

## 2022-04-06 DIAGNOSIS — F331 Major depressive disorder, recurrent, moderate: Secondary | ICD-10-CM | POA: Diagnosis not present

## 2022-04-06 DIAGNOSIS — M5136 Other intervertebral disc degeneration, lumbar region: Secondary | ICD-10-CM | POA: Diagnosis not present

## 2022-04-19 DIAGNOSIS — K080 Exfoliation of teeth due to systemic causes: Secondary | ICD-10-CM | POA: Diagnosis not present

## 2022-05-05 DIAGNOSIS — Z6832 Body mass index (BMI) 32.0-32.9, adult: Secondary | ICD-10-CM | POA: Diagnosis not present

## 2022-05-05 DIAGNOSIS — J209 Acute bronchitis, unspecified: Secondary | ICD-10-CM | POA: Diagnosis not present

## 2022-05-05 DIAGNOSIS — J019 Acute sinusitis, unspecified: Secondary | ICD-10-CM | POA: Diagnosis not present

## 2022-06-27 DIAGNOSIS — Z1329 Encounter for screening for other suspected endocrine disorder: Secondary | ICD-10-CM | POA: Diagnosis not present

## 2022-06-27 DIAGNOSIS — K219 Gastro-esophageal reflux disease without esophagitis: Secondary | ICD-10-CM | POA: Diagnosis not present

## 2022-06-27 DIAGNOSIS — R739 Hyperglycemia, unspecified: Secondary | ICD-10-CM | POA: Diagnosis not present

## 2022-06-27 DIAGNOSIS — E7849 Other hyperlipidemia: Secondary | ICD-10-CM | POA: Diagnosis not present

## 2022-06-27 DIAGNOSIS — I10 Essential (primary) hypertension: Secondary | ICD-10-CM | POA: Diagnosis not present

## 2022-07-03 DIAGNOSIS — E7849 Other hyperlipidemia: Secondary | ICD-10-CM | POA: Diagnosis not present

## 2022-07-03 DIAGNOSIS — M5136 Other intervertebral disc degeneration, lumbar region: Secondary | ICD-10-CM | POA: Diagnosis not present

## 2022-07-03 DIAGNOSIS — F331 Major depressive disorder, recurrent, moderate: Secondary | ICD-10-CM | POA: Diagnosis not present

## 2022-07-03 DIAGNOSIS — R4582 Worries: Secondary | ICD-10-CM | POA: Diagnosis not present

## 2022-07-03 DIAGNOSIS — I10 Essential (primary) hypertension: Secondary | ICD-10-CM | POA: Diagnosis not present

## 2022-09-26 DIAGNOSIS — R5382 Chronic fatigue, unspecified: Secondary | ICD-10-CM | POA: Diagnosis not present

## 2022-09-26 DIAGNOSIS — R739 Hyperglycemia, unspecified: Secondary | ICD-10-CM | POA: Diagnosis not present

## 2022-09-26 DIAGNOSIS — Z1329 Encounter for screening for other suspected endocrine disorder: Secondary | ICD-10-CM | POA: Diagnosis not present

## 2022-09-26 DIAGNOSIS — E7849 Other hyperlipidemia: Secondary | ICD-10-CM | POA: Diagnosis not present

## 2022-09-26 DIAGNOSIS — K219 Gastro-esophageal reflux disease without esophagitis: Secondary | ICD-10-CM | POA: Diagnosis not present

## 2022-09-27 DIAGNOSIS — F331 Major depressive disorder, recurrent, moderate: Secondary | ICD-10-CM | POA: Diagnosis not present

## 2022-09-27 DIAGNOSIS — I10 Essential (primary) hypertension: Secondary | ICD-10-CM | POA: Diagnosis not present

## 2022-09-27 DIAGNOSIS — M5136 Other intervertebral disc degeneration, lumbar region: Secondary | ICD-10-CM | POA: Diagnosis not present

## 2022-09-27 DIAGNOSIS — J029 Acute pharyngitis, unspecified: Secondary | ICD-10-CM | POA: Diagnosis not present

## 2022-09-27 DIAGNOSIS — G6289 Other specified polyneuropathies: Secondary | ICD-10-CM | POA: Diagnosis not present

## 2022-09-27 DIAGNOSIS — E7849 Other hyperlipidemia: Secondary | ICD-10-CM | POA: Diagnosis not present

## 2022-10-03 DIAGNOSIS — M779 Enthesopathy, unspecified: Secondary | ICD-10-CM | POA: Diagnosis not present

## 2022-10-03 DIAGNOSIS — L110 Acquired keratosis follicularis: Secondary | ICD-10-CM | POA: Diagnosis not present

## 2022-10-03 DIAGNOSIS — S93332A Other subluxation of left foot, initial encounter: Secondary | ICD-10-CM | POA: Diagnosis not present

## 2022-10-03 DIAGNOSIS — S93331A Other subluxation of right foot, initial encounter: Secondary | ICD-10-CM | POA: Diagnosis not present

## 2022-11-02 DIAGNOSIS — K080 Exfoliation of teeth due to systemic causes: Secondary | ICD-10-CM | POA: Diagnosis not present

## 2023-01-03 DIAGNOSIS — Z1321 Encounter for screening for nutritional disorder: Secondary | ICD-10-CM | POA: Diagnosis not present

## 2023-01-03 DIAGNOSIS — E7849 Other hyperlipidemia: Secondary | ICD-10-CM | POA: Diagnosis not present

## 2023-01-03 DIAGNOSIS — Z125 Encounter for screening for malignant neoplasm of prostate: Secondary | ICD-10-CM | POA: Diagnosis not present

## 2023-01-03 DIAGNOSIS — Z0000 Encounter for general adult medical examination without abnormal findings: Secondary | ICD-10-CM | POA: Diagnosis not present

## 2023-01-03 DIAGNOSIS — K219 Gastro-esophageal reflux disease without esophagitis: Secondary | ICD-10-CM | POA: Diagnosis not present

## 2023-01-03 DIAGNOSIS — Z1329 Encounter for screening for other suspected endocrine disorder: Secondary | ICD-10-CM | POA: Diagnosis not present

## 2023-01-08 DIAGNOSIS — I10 Essential (primary) hypertension: Secondary | ICD-10-CM | POA: Diagnosis not present

## 2023-01-08 DIAGNOSIS — E7849 Other hyperlipidemia: Secondary | ICD-10-CM | POA: Diagnosis not present

## 2023-01-08 DIAGNOSIS — Z23 Encounter for immunization: Secondary | ICD-10-CM | POA: Diagnosis not present

## 2023-01-08 DIAGNOSIS — F331 Major depressive disorder, recurrent, moderate: Secondary | ICD-10-CM | POA: Diagnosis not present

## 2023-01-08 DIAGNOSIS — G6289 Other specified polyneuropathies: Secondary | ICD-10-CM | POA: Diagnosis not present

## 2023-01-08 DIAGNOSIS — Z0001 Encounter for general adult medical examination with abnormal findings: Secondary | ICD-10-CM | POA: Diagnosis not present

## 2023-01-08 DIAGNOSIS — Z1331 Encounter for screening for depression: Secondary | ICD-10-CM | POA: Diagnosis not present

## 2023-01-08 DIAGNOSIS — Z1389 Encounter for screening for other disorder: Secondary | ICD-10-CM | POA: Diagnosis not present

## 2023-02-15 DIAGNOSIS — M6281 Muscle weakness (generalized): Secondary | ICD-10-CM | POA: Diagnosis not present

## 2023-02-15 DIAGNOSIS — M792 Neuralgia and neuritis, unspecified: Secondary | ICD-10-CM | POA: Diagnosis not present

## 2023-02-19 DIAGNOSIS — S93332D Other subluxation of left foot, subsequent encounter: Secondary | ICD-10-CM | POA: Diagnosis not present

## 2023-02-19 DIAGNOSIS — S93331D Other subluxation of right foot, subsequent encounter: Secondary | ICD-10-CM | POA: Diagnosis not present

## 2023-02-19 DIAGNOSIS — M779 Enthesopathy, unspecified: Secondary | ICD-10-CM | POA: Diagnosis not present

## 2023-02-19 DIAGNOSIS — M79671 Pain in right foot: Secondary | ICD-10-CM | POA: Diagnosis not present

## 2023-04-03 DIAGNOSIS — E7849 Other hyperlipidemia: Secondary | ICD-10-CM | POA: Diagnosis not present

## 2023-04-03 DIAGNOSIS — E782 Mixed hyperlipidemia: Secondary | ICD-10-CM | POA: Diagnosis not present

## 2023-04-03 DIAGNOSIS — R5382 Chronic fatigue, unspecified: Secondary | ICD-10-CM | POA: Diagnosis not present

## 2023-04-03 DIAGNOSIS — K219 Gastro-esophageal reflux disease without esophagitis: Secondary | ICD-10-CM | POA: Diagnosis not present

## 2023-04-03 DIAGNOSIS — I10 Essential (primary) hypertension: Secondary | ICD-10-CM | POA: Diagnosis not present

## 2023-04-05 DIAGNOSIS — F331 Major depressive disorder, recurrent, moderate: Secondary | ICD-10-CM | POA: Diagnosis not present

## 2023-04-05 DIAGNOSIS — Z6833 Body mass index (BMI) 33.0-33.9, adult: Secondary | ICD-10-CM | POA: Diagnosis not present

## 2023-04-05 DIAGNOSIS — I10 Essential (primary) hypertension: Secondary | ICD-10-CM | POA: Diagnosis not present

## 2023-04-05 DIAGNOSIS — E782 Mixed hyperlipidemia: Secondary | ICD-10-CM | POA: Diagnosis not present

## 2023-04-05 DIAGNOSIS — G9009 Other idiopathic peripheral autonomic neuropathy: Secondary | ICD-10-CM | POA: Diagnosis not present

## 2023-05-15 DIAGNOSIS — K080 Exfoliation of teeth due to systemic causes: Secondary | ICD-10-CM | POA: Diagnosis not present

## 2023-05-15 DIAGNOSIS — I739 Peripheral vascular disease, unspecified: Secondary | ICD-10-CM | POA: Diagnosis not present

## 2023-05-15 DIAGNOSIS — M79671 Pain in right foot: Secondary | ICD-10-CM | POA: Diagnosis not present

## 2023-05-15 DIAGNOSIS — M779 Enthesopathy, unspecified: Secondary | ICD-10-CM | POA: Diagnosis not present

## 2023-05-15 DIAGNOSIS — L110 Acquired keratosis follicularis: Secondary | ICD-10-CM | POA: Diagnosis not present

## 2023-06-26 DIAGNOSIS — I10 Essential (primary) hypertension: Secondary | ICD-10-CM | POA: Diagnosis not present

## 2023-06-26 DIAGNOSIS — Z1329 Encounter for screening for other suspected endocrine disorder: Secondary | ICD-10-CM | POA: Diagnosis not present

## 2023-06-26 DIAGNOSIS — R739 Hyperglycemia, unspecified: Secondary | ICD-10-CM | POA: Diagnosis not present

## 2023-06-26 DIAGNOSIS — E782 Mixed hyperlipidemia: Secondary | ICD-10-CM | POA: Diagnosis not present

## 2023-06-26 DIAGNOSIS — E7849 Other hyperlipidemia: Secondary | ICD-10-CM | POA: Diagnosis not present

## 2023-06-26 DIAGNOSIS — R5382 Chronic fatigue, unspecified: Secondary | ICD-10-CM | POA: Diagnosis not present

## 2023-07-03 DIAGNOSIS — F331 Major depressive disorder, recurrent, moderate: Secondary | ICD-10-CM | POA: Diagnosis not present

## 2023-07-03 DIAGNOSIS — E7849 Other hyperlipidemia: Secondary | ICD-10-CM | POA: Diagnosis not present

## 2023-07-03 DIAGNOSIS — G9009 Other idiopathic peripheral autonomic neuropathy: Secondary | ICD-10-CM | POA: Diagnosis not present

## 2023-07-03 DIAGNOSIS — I10 Essential (primary) hypertension: Secondary | ICD-10-CM | POA: Diagnosis not present

## 2023-07-03 DIAGNOSIS — Z6833 Body mass index (BMI) 33.0-33.9, adult: Secondary | ICD-10-CM | POA: Diagnosis not present

## 2023-07-03 DIAGNOSIS — E782 Mixed hyperlipidemia: Secondary | ICD-10-CM | POA: Diagnosis not present

## 2023-07-31 DIAGNOSIS — S93332D Other subluxation of left foot, subsequent encounter: Secondary | ICD-10-CM | POA: Diagnosis not present

## 2023-07-31 DIAGNOSIS — M79671 Pain in right foot: Secondary | ICD-10-CM | POA: Diagnosis not present

## 2023-07-31 DIAGNOSIS — S93331D Other subluxation of right foot, subsequent encounter: Secondary | ICD-10-CM | POA: Diagnosis not present

## 2023-07-31 DIAGNOSIS — M779 Enthesopathy, unspecified: Secondary | ICD-10-CM | POA: Diagnosis not present

## 2023-08-13 DIAGNOSIS — J029 Acute pharyngitis, unspecified: Secondary | ICD-10-CM | POA: Diagnosis not present

## 2023-08-13 DIAGNOSIS — Z6834 Body mass index (BMI) 34.0-34.9, adult: Secondary | ICD-10-CM | POA: Diagnosis not present

## 2023-08-13 DIAGNOSIS — R509 Fever, unspecified: Secondary | ICD-10-CM | POA: Diagnosis not present

## 2023-08-13 DIAGNOSIS — Z2089 Contact with and (suspected) exposure to other communicable diseases: Secondary | ICD-10-CM | POA: Diagnosis not present

## 2023-08-13 DIAGNOSIS — Z20828 Contact with and (suspected) exposure to other viral communicable diseases: Secondary | ICD-10-CM | POA: Diagnosis not present

## 2023-08-13 DIAGNOSIS — Z112 Encounter for screening for other bacterial diseases: Secondary | ICD-10-CM | POA: Diagnosis not present

## 2023-08-13 DIAGNOSIS — J019 Acute sinusitis, unspecified: Secondary | ICD-10-CM | POA: Diagnosis not present

## 2023-08-16 DIAGNOSIS — S61051A Open bite of right thumb without damage to nail, initial encounter: Secondary | ICD-10-CM | POA: Diagnosis not present

## 2023-08-16 DIAGNOSIS — Z6834 Body mass index (BMI) 34.0-34.9, adult: Secondary | ICD-10-CM | POA: Diagnosis not present

## 2023-08-16 DIAGNOSIS — L03011 Cellulitis of right finger: Secondary | ICD-10-CM | POA: Diagnosis not present

## 2023-10-03 ENCOUNTER — Emergency Department (HOSPITAL_COMMUNITY)

## 2023-10-03 ENCOUNTER — Inpatient Hospital Stay (HOSPITAL_COMMUNITY)
Admission: EM | Admit: 2023-10-03 | Discharge: 2023-10-06 | DRG: 871 | Disposition: A | Discharge status: Home / Self Care | Attending: Family Medicine | Admitting: Family Medicine

## 2023-10-03 ENCOUNTER — Encounter (HOSPITAL_COMMUNITY): Payer: Self-pay | Admitting: *Deleted

## 2023-10-03 ENCOUNTER — Other Ambulatory Visit: Payer: Self-pay

## 2023-10-03 DIAGNOSIS — N410 Acute prostatitis: Secondary | ICD-10-CM | POA: Diagnosis present

## 2023-10-03 DIAGNOSIS — E66812 Obesity, class 2: Secondary | ICD-10-CM | POA: Diagnosis present

## 2023-10-03 DIAGNOSIS — Z6835 Body mass index (BMI) 35.0-35.9, adult: Secondary | ICD-10-CM | POA: Diagnosis not present

## 2023-10-03 DIAGNOSIS — E785 Hyperlipidemia, unspecified: Secondary | ICD-10-CM | POA: Diagnosis present

## 2023-10-03 DIAGNOSIS — I10 Essential (primary) hypertension: Secondary | ICD-10-CM | POA: Diagnosis not present

## 2023-10-03 DIAGNOSIS — Z1152 Encounter for screening for COVID-19: Secondary | ICD-10-CM

## 2023-10-03 DIAGNOSIS — Z79899 Other long term (current) drug therapy: Secondary | ICD-10-CM

## 2023-10-03 DIAGNOSIS — Z8249 Family history of ischemic heart disease and other diseases of the circulatory system: Secondary | ICD-10-CM

## 2023-10-03 DIAGNOSIS — N390 Urinary tract infection, site not specified: Secondary | ICD-10-CM | POA: Diagnosis not present

## 2023-10-03 DIAGNOSIS — B9620 Unspecified Escherichia coli [E. coli] as the cause of diseases classified elsewhere: Secondary | ICD-10-CM | POA: Diagnosis present

## 2023-10-03 DIAGNOSIS — A419 Sepsis, unspecified organism: Secondary | ICD-10-CM | POA: Diagnosis not present

## 2023-10-03 DIAGNOSIS — E871 Hypo-osmolality and hyponatremia: Secondary | ICD-10-CM | POA: Diagnosis present

## 2023-10-03 DIAGNOSIS — E876 Hypokalemia: Secondary | ICD-10-CM | POA: Diagnosis not present

## 2023-10-03 DIAGNOSIS — Z1611 Resistance to penicillins: Secondary | ICD-10-CM | POA: Diagnosis not present

## 2023-10-03 DIAGNOSIS — Z881 Allergy status to other antibiotic agents status: Secondary | ICD-10-CM | POA: Diagnosis not present

## 2023-10-03 DIAGNOSIS — K5730 Diverticulosis of large intestine without perforation or abscess without bleeding: Secondary | ICD-10-CM | POA: Diagnosis not present

## 2023-10-03 DIAGNOSIS — Z87891 Personal history of nicotine dependence: Secondary | ICD-10-CM | POA: Diagnosis not present

## 2023-10-03 DIAGNOSIS — E872 Acidosis, unspecified: Secondary | ICD-10-CM | POA: Diagnosis present

## 2023-10-03 DIAGNOSIS — R6520 Severe sepsis without septic shock: Secondary | ICD-10-CM | POA: Diagnosis present

## 2023-10-03 DIAGNOSIS — N419 Inflammatory disease of prostate, unspecified: Secondary | ICD-10-CM | POA: Diagnosis present

## 2023-10-03 DIAGNOSIS — R509 Fever, unspecified: Secondary | ICD-10-CM | POA: Diagnosis not present

## 2023-10-03 DIAGNOSIS — F1090 Alcohol use, unspecified, uncomplicated: Secondary | ICD-10-CM | POA: Diagnosis present

## 2023-10-03 DIAGNOSIS — R9389 Abnormal findings on diagnostic imaging of other specified body structures: Secondary | ICD-10-CM | POA: Diagnosis not present

## 2023-10-03 DIAGNOSIS — R6521 Severe sepsis with septic shock: Secondary | ICD-10-CM | POA: Diagnosis present

## 2023-10-03 LAB — URINALYSIS, W/ REFLEX TO CULTURE (INFECTION SUSPECTED)
Bilirubin Urine: NEGATIVE
Glucose, UA: NEGATIVE mg/dL
Ketones, ur: 5 mg/dL — AB
Nitrite: POSITIVE — AB
Protein, ur: 100 mg/dL — AB
RBC / HPF: >50 RBC/hpf (ref 0–5)
Specific Gravity, Urine: 1.021 (ref 1.005–1.030)
WBC, UA: >50 WBC/hpf (ref 0–5)
pH: 6 (ref 5.0–8.0)

## 2023-10-03 LAB — CBC WITH DIFFERENTIAL/PLATELET
Abs Immature Granulocytes: 0.1 K/uL — ABNORMAL HIGH (ref 0.00–0.07)
Basophils Absolute: 0 K/uL (ref 0.0–0.1)
Basophils Relative: 0 %
Eosinophils Absolute: 0 K/uL (ref 0.0–0.5)
Eosinophils Relative: 0 %
HCT: 40.8 % (ref 39.0–52.0)
Hemoglobin: 13.6 g/dL (ref 13.0–17.0)
Immature Granulocytes: 1 %
Lymphocytes Relative: 4 %
Lymphs Abs: 0.7 K/uL (ref 0.7–4.0)
MCH: 27.7 pg (ref 26.0–34.0)
MCHC: 33.3 g/dL (ref 30.0–36.0)
MCV: 83.1 fL (ref 80.0–100.0)
Monocytes Absolute: 0.1 K/uL (ref 0.1–1.0)
Monocytes Relative: 1 %
Neutro Abs: 16.2 K/uL — ABNORMAL HIGH (ref 1.7–7.7)
Neutrophils Relative %: 94 %
Platelets: 217 K/uL (ref 150–400)
RBC: 4.91 MIL/uL (ref 4.22–5.81)
RDW: 14.4 % (ref 11.5–15.5)
WBC: 17.1 K/uL — ABNORMAL HIGH (ref 4.0–10.5)
nRBC: 0 % (ref 0.0–0.2)

## 2023-10-03 LAB — RESP PANEL BY RT-PCR (RSV, FLU A&B, COVID)  RVPGX2
Influenza A by PCR: NEGATIVE
Influenza B by PCR: NEGATIVE
Resp Syncytial Virus by PCR: NEGATIVE
SARS Coronavirus 2 by RT PCR: NEGATIVE

## 2023-10-03 LAB — COMPREHENSIVE METABOLIC PANEL WITH GFR
ALT: 32 U/L (ref 0–44)
AST: 41 U/L (ref 15–41)
Albumin: 3.9 g/dL (ref 3.5–5.0)
Alkaline Phosphatase: 98 U/L (ref 38–126)
Anion gap: 15 (ref 5–15)
BUN: 14 mg/dL (ref 6–20)
CO2: 15 mmol/L — ABNORMAL LOW (ref 22–32)
Calcium: 8.8 mg/dL — ABNORMAL LOW (ref 8.9–10.3)
Chloride: 102 mmol/L (ref 98–111)
Creatinine, Ser: 1.18 mg/dL (ref 0.61–1.24)
GFR, Estimated: >60 mL/min (ref >60)
Glucose, Bld: 194 mg/dL — ABNORMAL HIGH (ref 70–99)
Potassium: 3.3 mmol/L — ABNORMAL LOW (ref 3.5–5.1)
Sodium: 132 mmol/L — ABNORMAL LOW (ref 135–145)
Total Bilirubin: 1.9 mg/dL — ABNORMAL HIGH (ref 0.0–1.2)
Total Protein: 7.2 g/dL (ref 6.5–8.1)

## 2023-10-03 LAB — MAGNESIUM: Magnesium: 1.7 mg/dL (ref 1.7–2.4)

## 2023-10-03 LAB — LACTIC ACID, PLASMA
Lactic Acid, Venous: 5.2 mmol/L (ref 0.5–1.9)
Lactic Acid, Venous: 2.5 mmol/L (ref 0.5–1.9)

## 2023-10-03 MED ORDER — LACTATED RINGERS IV BOLUS (SEPSIS)
1000.0000 mL | Freq: Once | INTRAVENOUS | Status: AC
  Administered 2023-10-03: 1000 mL via INTRAVENOUS

## 2023-10-03 MED ORDER — ACETAMINOPHEN 650 MG RE SUPP
650.0000 mg | Freq: Four times a day (QID) | RECTAL | Status: DC | PRN
Start: 2023-10-03 — End: 2023-10-06

## 2023-10-03 MED ORDER — MORPHINE SULFATE (PF) 2 MG/ML IV SOLN: 2.0000 mg | INTRAVENOUS | Status: DC | PRN

## 2023-10-03 MED ORDER — VANCOMYCIN HCL 1250 MG/250ML IV SOLN
1250.0000 mg | Freq: Two times a day (BID) | INTRAVENOUS | Status: DC
  Administered 2023-10-04 – 2023-10-05 (×3): 1250 mg via INTRAVENOUS
  Filled 2023-10-03 (×3): qty 250

## 2023-10-03 MED ORDER — TAPENTADOL HCL 75 MG PO TABS: 75.0000 mg | ORAL_TABLET | Freq: Three times a day (TID) | ORAL | Status: DC | PRN

## 2023-10-03 MED ORDER — ONDANSETRON HCL 4 MG PO TABS
4.0000 mg | ORAL_TABLET | Freq: Four times a day (QID) | ORAL | Status: DC | PRN
Start: 2023-10-03 — End: 2023-10-06

## 2023-10-03 MED ORDER — VANCOMYCIN HCL 2000 MG/400ML IV SOLN
2000.0000 mg | Freq: Once | INTRAVENOUS | Status: AC
  Administered 2023-10-03: 2000 mg via INTRAVENOUS
  Filled 2023-10-03: qty 400

## 2023-10-03 MED ORDER — LORAZEPAM 2 MG/ML IJ SOLN: 1.0000 mg | INTRAMUSCULAR | Status: DC | PRN

## 2023-10-03 MED ORDER — ADULT MULTIVITAMIN W/MINERALS CH
1.0000 | ORAL_TABLET | Freq: Every day | ORAL | Status: DC
  Administered 2023-10-03 – 2023-10-06 (×4): 1 via ORAL
  Filled 2023-10-03 (×4): qty 1

## 2023-10-03 MED ORDER — ROSUVASTATIN CALCIUM 10 MG PO TABS
10.0000 mg | ORAL_TABLET | ORAL | Status: DC
  Administered 2023-10-03 – 2023-10-05 (×2): 10 mg via ORAL
  Filled 2023-10-03 (×2): qty 1

## 2023-10-03 MED ORDER — PREGABALIN 75 MG PO CAPS
200.0000 mg | ORAL_CAPSULE | Freq: Three times a day (TID) | ORAL | Status: DC
  Administered 2023-10-03 – 2023-10-06 (×8): 200 mg via ORAL
  Filled 2023-10-03 (×8): qty 1

## 2023-10-03 MED ORDER — ENOXAPARIN SODIUM 60 MG/0.6ML IJ SOSY
50.0000 mg | PREFILLED_SYRINGE | INTRAMUSCULAR | Status: DC
  Administered 2023-10-03: 50 mg via SUBCUTANEOUS
  Filled 2023-10-03 (×3): qty 0.6

## 2023-10-03 MED ORDER — SODIUM CHLORIDE 0.9 % IV SOLN: INTRAVENOUS | Status: AC

## 2023-10-03 MED ORDER — IRBESARTAN 150 MG PO TABS
150.0000 mg | ORAL_TABLET | Freq: Every day | ORAL | Status: DC
  Filled 2023-10-03: qty 1

## 2023-10-03 MED ORDER — ACETAMINOPHEN 325 MG PO TABS
650.0000 mg | ORAL_TABLET | Freq: Four times a day (QID) | ORAL | Status: DC | PRN
Start: 2023-10-03 — End: 2023-10-06
  Administered 2023-10-04 – 2023-10-05 (×3): 650 mg via ORAL
  Filled 2023-10-03 (×3): qty 2

## 2023-10-03 MED ORDER — HYDROCHLOROTHIAZIDE 12.5 MG PO TABS
12.5000 mg | ORAL_TABLET | Freq: Every day | ORAL | Status: DC
  Filled 2023-10-03: qty 1

## 2023-10-03 MED ORDER — LACTATED RINGERS IV SOLN: INTRAVENOUS | Status: DC

## 2023-10-03 MED ORDER — SODIUM CHLORIDE 0.9 % IV SOLN: 2.0000 g | INTRAVENOUS | Status: DC

## 2023-10-03 MED ORDER — HYDROMORPHONE HCL 1 MG/ML IJ SOLN
1.0000 mg | Freq: Once | INTRAMUSCULAR | Status: AC
  Administered 2023-10-03: 1 mg via INTRAVENOUS
  Filled 2023-10-03: qty 1

## 2023-10-03 MED ORDER — ONDANSETRON HCL 4 MG/2ML IJ SOLN: 4.0000 mg | Freq: Four times a day (QID) | INTRAMUSCULAR | Status: DC | PRN

## 2023-10-03 MED ORDER — POLYETHYLENE GLYCOL 3350 17 G PO PACK
17.0000 g | PACK | Freq: Every day | ORAL | Status: DC | PRN
  Administered 2023-10-03: 17 g via ORAL
  Filled 2023-10-03: qty 1

## 2023-10-03 MED ORDER — ONDANSETRON HCL 4 MG/2ML IJ SOLN
4.0000 mg | Freq: Once | INTRAMUSCULAR | Status: AC
  Administered 2023-10-03: 4 mg via INTRAVENOUS
  Filled 2023-10-03: qty 2

## 2023-10-03 MED ORDER — KETOROLAC TROMETHAMINE 15 MG/ML IJ SOLN: 15.0000 mg | Freq: Once | INTRAMUSCULAR | Status: DC

## 2023-10-03 MED ORDER — THIAMINE MONONITRATE 100 MG PO TABS
100.0000 mg | ORAL_TABLET | Freq: Every day | ORAL | Status: DC
  Administered 2023-10-03 – 2023-10-06 (×3): 100 mg via ORAL
  Filled 2023-10-03 (×3): qty 1

## 2023-10-03 MED ORDER — SODIUM CHLORIDE 0.9 % IV SOLN
2.0000 g | Freq: Once | INTRAVENOUS | Status: AC
  Administered 2023-10-03: 2 g via INTRAVENOUS
  Filled 2023-10-03: qty 12.5

## 2023-10-03 MED ORDER — SODIUM CHLORIDE 0.9 % IV SOLN
2.0000 g | Freq: Three times a day (TID) | INTRAVENOUS | Status: DC
  Administered 2023-10-04 – 2023-10-05 (×5): 2 g via INTRAVENOUS
  Filled 2023-10-03 (×5): qty 12.5

## 2023-10-03 MED ORDER — THIAMINE HCL 100 MG/ML IJ SOLN
100.0000 mg | Freq: Every day | INTRAMUSCULAR | Status: DC
  Administered 2023-10-04: 100 mg via INTRAVENOUS
  Filled 2023-10-03: qty 2

## 2023-10-03 MED ORDER — POTASSIUM CHLORIDE CRYS ER 20 MEQ PO TBCR
40.0000 meq | EXTENDED_RELEASE_TABLET | ORAL | Status: AC
  Administered 2023-10-03 (×2): 40 meq via ORAL
  Filled 2023-10-03 (×2): qty 2

## 2023-10-03 MED ORDER — VALSARTAN-HYDROCHLOROTHIAZIDE 160-12.5 MG PO TABS: 1.0000 | ORAL_TABLET | Freq: Every day | ORAL | Status: DC

## 2023-10-03 MED ORDER — ENOXAPARIN SODIUM 40 MG/0.4ML IJ SOSY: 40.0000 mg | PREFILLED_SYRINGE | INTRAMUSCULAR | Status: DC

## 2023-10-03 MED ORDER — FOLIC ACID 1 MG PO TABS
1.0000 mg | ORAL_TABLET | Freq: Every day | ORAL | Status: DC
  Administered 2023-10-03 – 2023-10-06 (×4): 1 mg via ORAL
  Filled 2023-10-03 (×4): qty 1

## 2023-10-03 MED ORDER — LORAZEPAM 1 MG PO TABS: 1.0000 mg | ORAL_TABLET | ORAL | Status: DC | PRN

## 2023-10-03 NOTE — Assessment & Plan Note (Addendum)
 Drinks 4-5 beers every other day.  Denies history of withdrawal. -CIWA as needed - K-3.3, check mag

## 2023-10-03 NOTE — ED Triage Notes (Signed)
 Pt with fever since last night, 104 this morning- two advil given PTA. + SOB + nausea decrease in urine output and pressure to lower abd with urination.

## 2023-10-03 NOTE — Sepsis Progress Note (Signed)
 Code sepsis protocol being monitored by eLink.

## 2023-10-03 NOTE — ED Notes (Signed)
 Called to 300 for courtesy call- pt has had bed ready for over 40 minutes

## 2023-10-03 NOTE — Assessment & Plan Note (Addendum)
 Blood pressure 105-113. -Hold valsartan /HCTZ in the setting of severe sepsis, resume tomorrow

## 2023-10-03 NOTE — Assessment & Plan Note (Signed)
 Meeting severe sepsis criteria with leukocytosis of 17.1, tachycardia heart rate 100-138, dyspnea respiratory 19-24, with evidence of endorgan dysfunction significant lactic acidosis of 5.2 >> 2.5.  Etiology likely prostatitis/urinary tract infection. - IV Vanco and IV cefepime  started, continue with IV cefepime  -Follow-up blood and urine cultures -3 L bolus given, continue N/s 125cc/hr x 20hrs

## 2023-10-03 NOTE — H&P (Addendum)
 History and Physical    Jose Logan FMW:[id number] DOB: 01/18/1971 DOA: 10/03/2023  PCP: Toribio Jerel MATSU, MD   Patient coming from: Home  I have personally briefly reviewed patient's old medical records in [facility]  Chief Complaint: Fever, urinary symptoms  HPI: Jose Logan is a 53 y.o. male with medical history significant for hypertension. Patient presented to the ED with complaints of fever of 104 last night, and fever this morning, he took 2 doses of ibuprofen before he came to the ED.  He also reports lower abdominal pressure when he tried to urinate.  He reports he was shaking so bad that he felt like he was having some difficulty breathing-but that has improved now.  No cough.  Reports nausea without vomiting.  ED Course: Tmax 99.1.  Tachycardic heart rate 100-138.  Blood pressure systolic 105-113.  Respiratory rate 19-24.  O2 sats greater than 96% on room air. WBC 17.1.  Lactic acidosis 5.2 >> 2.5.  UA suggestive of UTI.  2 view chest x-ray clear. CT renal stone study-fat stranding surrounding the prostate and bilateral seminal vesicles-reflect sequelae of prostatitis.  Prostate not enlarged.  No evidence of obstructive uropathy or calculi. Blood and urine cultures obtained IV vancomycin  and cefepime  started.   3 L bolus given.  Hospitalist to admit   Review of Systems: As per HPI all other systems reviewed and negative.  Past Medical History:  Diagnosis Date   Hypertension    Lumbar pain    Numbness    top of right foot and left toes, dr duwayne aware for last year    Past Surgical History:  Procedure Laterality Date   hemorroid surgery and colonscopy     LUMBAR LAMINECTOMY/DECOMPRESSION MICRODISCECTOMY N/A 09/14/2016   Procedure: Bilateral Decompression L4-5;  Surgeon: duwayne Purchase, MD;  Location: [facility];  Service: Orthopedics;  Laterality: N/A;  120 mins     reports that he has never smoked. He uses smokeless tobacco. He reports current alcohol  use. He  reports that he does not use drugs.  Allergies  Allergen Reactions   Tetracyclines & Related Rash   Family history of hypertension.  Prior to Admission medications   Medication Sig Start Date End Date Taking? Authorizing Provider  fluticasone (FLONASE) 50 MCG/ACT nasal spray Place 1 spray into both nostrils daily. 05/18/23  Yes [provider]  ibuprofen (ADVIL) 200 MG tablet Take 400 mg by mouth every 6 (six) hours as needed for moderate pain (pain score 4-6).   Yes [provider]  NUCYNTA  75 MG tablet Take 75 mg by mouth 3 (three) times daily as needed.   Yes [provider]  pregabalin  (LYRICA ) 200 MG capsule Take 200 mg by mouth 3 (three) times daily. 09/14/23  Yes [provider]  rosuvastatin  (CRESTOR ) 10 MG tablet Take 10 mg by mouth every other day.   Yes [provider]  traZODone (DESYREL) 50 MG tablet Take 50 mg by mouth at bedtime as needed.   Yes [provider]  valsartan -hydrochlorothiazide  (DIOVAN -HCT) 160-12.5 MG tablet Take 1 tablet by mouth daily. 09/14/23  Yes [provider]    Physical Exam: Vitals:   10/03/23 1122 10/03/23 1122 10/03/23 1300  BP:  105/71 113/79  Pulse:  (!) 138 100  Resp:  (!) 24 19  Temp:  99.1 F (37.3 C) 98 F (36.7 C)  TempSrc:  Oral Oral  SpO2:  97% 96%  Weight: 104.3 kg    Height: 5' 9 (1.753 m)  Constitutional: NAD, calm, comfortable Vitals:   10/03/23 1122 10/03/23 1122 10/03/23 1300  BP:  105/71 113/79  Pulse:  (!) 138 100  Resp:  (!) 24 19  Temp:  99.1 F (37.3 C) 98 F (36.7 C)  TempSrc:  Oral Oral  SpO2:  97% 96%  Weight: 104.3 kg    Height: 5' 9 (1.753 m)     Eyes: PERRL, lids and conjunctivae normal ENMT: Mucous membranes are moist.  Neck: normal, supple, no masses, no thyromegaly Respiratory: clear to auscultation bilaterally, no wheezing, no crackles.  Cardiovascular: Tachycardic, regular rate and rhythm, no murmurs / rubs / gallops. No  extremity edema.  Extremities warm Abdomen: no tenderness, no masses palpated. No hepatosplenomegaly. Bowel sounds positive.  Musculoskeletal: no clubbing / cyanosis. No joint deformity upper and lower extremities.   Skin: no rashes, lesions, ulcers. No induration Neurologic: No facial asymmetry, moving extremity spontaneously, speech fluent Psychiatric: Normal judgment and insight. Alert and oriented x 3. Normal mood.   Labs on Admission: I have personally reviewed following labs and imaging studies  CBC: Recent Labs  Lab 10/03/23 1137  WBC 17.1*  NEUTROABS 16.2*  HGB 13.6  HCT 40.8  MCV 83.1  PLT 217   Basic Metabolic Panel: Recent Labs  Lab 10/03/23 1137  NA 132*  K 3.3*  CL 102  CO2 15*  GLUCOSE 194*  BUN 14  CREATININE 1.18  CALCIUM  8.8*   GFR: Estimated Creatinine Clearance: 85.1 mL/min (by C-G formula based on SCr of 1.18 mg/dL). Liver Function Tests: Recent Labs  Lab 10/03/23 1137  AST 41  ALT 32  ALKPHOS 98  BILITOT 1.9*  PROT 7.2  ALBUMIN 3.9   Urine analysis:    Component Value Date/Time   COLORURINE YELLOW 10/03/2023 1232   APPEARANCEUR CLOUDY (A) 10/03/2023 1232   LABSPEC 1.021 10/03/2023 1232   PHURINE 6.0 10/03/2023 1232   GLUCOSEU NEGATIVE 10/03/2023 1232   HGBUR MODERATE (A) 10/03/2023 1232   BILIRUBINUR NEGATIVE 10/03/2023 1232   KETONESUR 5 (A) 10/03/2023 1232   PROTEINUR 100 (A) 10/03/2023 1232   NITRITE POSITIVE (A) 10/03/2023 1232   LEUKOCYTESUR MODERATE (A) 10/03/2023 1232    Radiological Exams on Admission: CT Renal Stone Study Result Date: 10/03/2023 CLINICAL DATA:  Fever, short of breath, nausea, dysuria, decreased urine output EXAM: CT ABDOMEN AND PELVIS WITHOUT CONTRAST TECHNIQUE: Multidetector CT imaging of the abdomen and pelvis was performed following the standard protocol without IV contrast. RADIATION DOSE REDUCTION: This exam was performed according to the departmental dose-optimization program which includes automated  exposure control, adjustment of the mA and/or kV according to patient size and/or use of iterative reconstruction technique. COMPARISON:  None Available. FINDINGS: Lower chest: No acute pleural or parenchymal lung disease. Hepatobiliary: Decreased liver attenuation consistent with hepatic steatosis. Gallbladder is unremarkable. No biliary duct dilation. Pancreas: Unremarkable unenhanced appearance. Spleen: Unremarkable unenhanced appearance. Adrenals/Urinary Tract: No urinary tract calculi or obstructive uropathy within either kidney. The adrenals are unremarkable. The bladder is decompressed, limiting its evaluation. Stomach/Bowel: No bowel obstruction or ileus. Normal appendix right lower quadrant. Scattered distal colonic diverticulosis without evidence of acute diverticulitis. Vascular/Lymphatic: Aortic atherosclerosis. No enlarged abdominal or pelvic lymph nodes. Reproductive: The prostate is not enlarged. There is mild fat stranding surrounding the prostate and seminal vesicles, which could reflect underlying prostatitis. Other: No free fluid or free intraperitoneal gas. No abdominal wall hernia. Musculoskeletal: No acute or destructive bony abnormalities. Reconstructed images demonstrate no additional findings. IMPRESSION: 1. Fat stranding surrounding the prostate  and bilateral seminal vesicles, which could reflect sequela of prostatitis. The prostate is not enlarged. 2. No evidence of urinary tract calculi or obstructive uropathy. 3. Distal colonic diverticulosis without diverticulitis. 4.  Aortic Atherosclerosis (ICD10-I70.0). 5. Hepatic steatosis. Electronically Signed   By: Ozell Daring M.D.   On: 10/03/2023 14:30   DG Chest 2 View Result Date: 10/03/2023 CLINICAL DATA:  Fever. EXAM: CHEST - 2 VIEW COMPARISON:  None available. FINDINGS: Cardiomediastinal silhouette and pulmonary vasculature are within normal limits. Lungs are clear. The RIGHT hemidiaphragm is elevated. IMPRESSION: No acute  cardiopulmonary process. Electronically Signed   By: Aliene Lloyd M.D.   On: 10/03/2023 12:55   EKG: None.   Assessment/Plan Principal Problem:   Severe sepsis (HCC) Active Problems:   Prostatitis   HTN (hypertension)   Assessment and Plan: * Severe sepsis (HCC) Meeting severe sepsis criteria with leukocytosis of 17.1, tachycardia heart rate 100-138, dyspnea respiratory 19-24, with evidence of endorgan dysfunction significant lactic acidosis of 5.2 >> 2.5.  Etiology likely prostatitis/urinary tract infection. - IV Vanco and IV cefepime  started, continue with IV cefepime  -Follow-up blood and urine cultures -3 L bolus given, continue N/s 125cc/hr x 20hrs  Prostatitis Prostatitis/UTI with severe sepsis.  CT renal stone study-  Fat stranding surrounding the prostate and bilateral seminal vesicles, which could reflect sequela of prostatitis. The prostate is not enlarged.  No evidence of obstructive uropathy or urinary tract calculi. -Plan per above   Alcohol  use Drinks 4-5 beers every other day.  Denies history of withdrawal. -CIWA as needed - K-3.3, check mag  HTN (hypertension) Blood pressure 105-113. -Hold valsartan /HCTZ in the setting of severe sepsis, resume tomorrow   DVT prophylaxis: Lovenox  Code Status: FULL Family Communication: None at bedside Disposition Plan: ~ 2 days Consults called: None Admission status:  Inpt Tele I certify that at the point of admission it is my clinical judgment that the patient will require inpatient hospital care spanning beyond 2 midnights from the point of admission due to high intensity of service, high risk for further deterioration and high frequency of surveillance required.    Author: Tully FORBES Carwin, MD 10/03/2023 4:47 PM  For on call review [email or web address].

## 2023-10-03 NOTE — Progress Notes (Signed)
 Pharmacy Antibiotic Note  Jose Logan is a 53 y.o. male admitted on 10/03/2023 with sepsis.  Pharmacy has been consulted for vancomycin  dosing.  Plan: Vancomycin  2000 mg IV x 1 dose Vancomycin  1250 mg IV every 12 hours. Monitor labs, c/s, and vanco levels as indicated.  Height: 5' 9 (175.3 cm) Weight: 104.3 kg (230 lb) IBW/kg (Calculated) : 70.7  Temp (24hrs), Avg:99.1 F (37.3 C), Min:99.1 F (37.3 C), Max:99.1 F (37.3 C)  Recent Labs  Lab 10/03/23 1137  WBC 17.1*  CREATININE 1.18  LATICACIDVEN 5.2*    Estimated Creatinine Clearance: 85.1 mL/min (by C-G formula based on SCr of 1.18 mg/dL).    Allergies  Allergen Reactions   Tetracyclines & Related Rash    Antimicrobials this admission: Vanco 8/6 >> Cefepime  8/6   Microbiology results: 8/6 BCx: pending 8/6 UCx: pending    Thank you for allowing pharmacy to be a part of this patient's care.  Elspeth Sour, PharmD Clinical Pharmacist 10/03/2023 1:01 PM

## 2023-10-03 NOTE — Progress Notes (Signed)
 PHARMACIST - PHYSICIAN COMMUNICATION  CONCERNING:  Enoxaparin  (Lovenox ) for DVT Prophylaxis    RECOMMENDATION: Patient was prescribed enoxaprin 40mg  q24 hours for VTE prophylaxis.   Filed Weights   10/03/23 1122  Weight: 104.3 kg (230 lb)    Body mass index is 33.97 kg/m.  Estimated Creatinine Clearance: 85.1 mL/min (by C-G formula based on SCr of 1.18 mg/dL).   Based on [facility] policy patient is candidate for enoxaparin  0.5mg /kg TBW SQ every 24 hours based on BMI being >30.   DESCRIPTION: Pharmacy has adjusted enoxaparin  dose per [facility] policy.  Patient is now receiving enoxaparin  50 mg every 24 hours   Estill CHRISTELLA Lutes, PharmD, BCPS Clinical Pharmacist 10/03/2023 8:05 PM

## 2023-10-03 NOTE — ED Notes (Signed)
 Transition of Care [facility]) - Inpatient Brief Assessment   Patient Details  Name: Jose Logan MRN: [id number] Date of Birth: February 12, 1971  Transition of Care [facility]) CM/SW Contact:    Noreen KATHEE Pinal, LCSWA Phone Number: 10/03/2023, 12:56 PM   Clinical Narrative:   Transition of Care Department [facility]) has reviewed patient and no TOC needs have been identified at this time. We will continue to monitor patient advancement through interdisciplinary progression rounds. If new patient transition needs arise, please place a TOC consult.  Transition of Care Asessment: Insurance and Status: Insurance coverage has been reviewed Patient has primary care physician: Yes Home environment has been reviewed: Single Family Home with Spouse Prior level of function:: Independent Prior/Current Home Services: No current home services Social Drivers of Health Review: SDOH reviewed interventions complete (Smoking resources added to AVS) Readmission risk has been reviewed: Yes Transition of care needs: no transition of care needs at this time

## 2023-10-03 NOTE — Assessment & Plan Note (Signed)
 Prostatitis/UTI with severe sepsis.  CT renal stone study-  Fat stranding surrounding the prostate and bilateral seminal vesicles, which could reflect sequela of prostatitis. The prostate is not enlarged.  No evidence of obstructive uropathy or urinary tract calculi. -Plan per above

## 2023-10-03 NOTE — ED Provider Notes (Signed)
 [place] EMERGENCY DEPARTMENT AT [facility] Provider Note   CSN: [id number] Arrival date & time: 10/03/23  1058     Patient presents with: Shortness of Breath   Jose Logan is a 53 y.o. male.   53 year old male with history of hypertension and hyperlipidemia who presents emergency department with fever and dysuria.  Patient reports that yesterday started having some burning when he peed.  Felt like he could not empty his bladder all the way.  No flank pain.  Started having some fevers today.  Tmax of 104F at home.  Took some Advil at 11 for coming in because of the pain.  Also says he started feeling short of breath.  No penile discharge.  No concern for STIs.       Prior to Admission medications   Medication Sig Start Date End Date Taking? Authorizing Provider  fluticasone (FLONASE) 50 MCG/ACT nasal spray Place 1 spray into both nostrils daily. 05/18/23  Yes [provider]  ibuprofen (ADVIL) 200 MG tablet Take 400 mg by mouth every 6 (six) hours as needed for moderate pain (pain score 4-6).   Yes [provider]  NUCYNTA  75 MG tablet Take 75 mg by mouth 3 (three) times daily as needed.   Yes [provider]  pregabalin  (LYRICA ) 200 MG capsule Take 200 mg by mouth 3 (three) times daily. 09/14/23  Yes [provider]  rosuvastatin  (CRESTOR ) 10 MG tablet Take 10 mg by mouth every other day.   Yes [provider]  traZODone (DESYREL) 50 MG tablet Take 50 mg by mouth at bedtime as needed.   Yes [provider]  valsartan -hydrochlorothiazide  (DIOVAN -HCT) 160-12.5 MG tablet Take 1 tablet by mouth daily. 09/14/23  Yes [provider]    Allergies: Tetracyclines & related    Review of Systems  Updated Vital Signs BP 113/79 (BP Location: Right Arm)   Pulse 100   Temp 98 F (36.7 C) (Oral)   Resp 19   Ht 5' 9 (1.753 m)   Wt 104.3 kg   SpO2 96%   BMI 33.97 kg/m   Physical Exam Vitals and nursing note  reviewed.  Constitutional:      General: He is not in acute distress.    Appearance: He is well-developed.  HENT:     Head: Normocephalic and atraumatic.     Right Ear: External ear normal.     Left Ear: External ear normal.     Nose: Nose normal.  Eyes:     Extraocular Movements: Extraocular movements intact.     Conjunctiva/sclera: Conjunctivae normal.     Pupils: Pupils are equal, round, and reactive to light.  Cardiovascular:     Rate and Rhythm: Regular rhythm. Tachycardia present.     Heart sounds: Normal heart sounds.  Pulmonary:     Effort: Pulmonary effort is normal. No respiratory distress.     Breath sounds: Normal breath sounds.  Abdominal:     General: There is no distension.     Palpations: Abdomen is soft. There is no mass.     Tenderness: There is no abdominal tenderness. There is no right CVA tenderness, left CVA tenderness or guarding.  Genitourinary:    Comments: No prostate tenderness palpation Musculoskeletal:     Cervical back: Normal range of motion and neck supple.  Skin:    General: Skin is warm and dry.  Neurological:     Mental Status: He is alert. Mental status is at baseline.  Psychiatric:  Mood and Affect: Mood normal.        Behavior: Behavior normal.     (all labs ordered are listed, but only abnormal results are displayed) Labs Reviewed  LACTIC ACID, PLASMA - Abnormal; Notable for the following components:      Result Value   Lactic Acid, Venous 5.2 (*)    All other components within normal limits  LACTIC ACID, PLASMA - Abnormal; Notable for the following components:   Lactic Acid, Venous 2.5 (*)    All other components within normal limits  COMPREHENSIVE METABOLIC PANEL WITH GFR - Abnormal; Notable for the following components:   Sodium 132 (*)    Potassium 3.3 (*)    CO2 15 (*)    Glucose, Bld 194 (*)    Calcium  8.8 (*)    Total Bilirubin 1.9 (*)    All other components within normal limits  CBC WITH DIFFERENTIAL/PLATELET  - Abnormal; Notable for the following components:   WBC 17.1 (*)    Neutro Abs 16.2 (*)    Abs Immature Granulocytes 0.10 (*)    All other components within normal limits  URINALYSIS, W/ REFLEX TO CULTURE (INFECTION SUSPECTED) - Abnormal; Notable for the following components:   APPearance CLOUDY (*)    Hgb urine dipstick MODERATE (*)    Ketones, ur 5 (*)    Protein, ur 100 (*)    Nitrite POSITIVE (*)    Leukocytes,Ua MODERATE (*)    Bacteria, UA RARE (*)    All other components within normal limits  RESP PANEL BY RT-PCR (RSV, FLU A&B, COVID)  RVPGX2  CULTURE, BLOOD (ROUTINE X 2)  CULTURE, BLOOD (ROUTINE X 2)  URINE CULTURE    EKG: None  Radiology: CT Renal Stone Study Result Date: 10/03/2023 CLINICAL DATA:  Fever, short of breath, nausea, dysuria, decreased urine output EXAM: CT ABDOMEN AND PELVIS WITHOUT CONTRAST TECHNIQUE: Multidetector CT imaging of the abdomen and pelvis was performed following the standard protocol without IV contrast. RADIATION DOSE REDUCTION: This exam was performed according to the departmental dose-optimization program which includes automated exposure control, adjustment of the mA and/or kV according to patient size and/or use of iterative reconstruction technique. COMPARISON:  None Available. FINDINGS: Lower chest: No acute pleural or parenchymal lung disease. Hepatobiliary: Decreased liver attenuation consistent with hepatic steatosis. Gallbladder is unremarkable. No biliary duct dilation. Pancreas: Unremarkable unenhanced appearance. Spleen: Unremarkable unenhanced appearance. Adrenals/Urinary Tract: No urinary tract calculi or obstructive uropathy within either kidney. The adrenals are unremarkable. The bladder is decompressed, limiting its evaluation. Stomach/Bowel: No bowel obstruction or ileus. Normal appendix right lower quadrant. Scattered distal colonic diverticulosis without evidence of acute diverticulitis. Vascular/Lymphatic: Aortic atherosclerosis. No  enlarged abdominal or pelvic lymph nodes. Reproductive: The prostate is not enlarged. There is mild fat stranding surrounding the prostate and seminal vesicles, which could reflect underlying prostatitis. Other: No free fluid or free intraperitoneal gas. No abdominal wall hernia. Musculoskeletal: No acute or destructive bony abnormalities. Reconstructed images demonstrate no additional findings. IMPRESSION: 1. Fat stranding surrounding the prostate and bilateral seminal vesicles, which could reflect sequela of prostatitis. The prostate is not enlarged. 2. No evidence of urinary tract calculi or obstructive uropathy. 3. Distal colonic diverticulosis without diverticulitis. 4.  Aortic Atherosclerosis (ICD10-I70.0). 5. Hepatic steatosis. Electronically Signed   By: Ozell Daring M.D.   On: 10/03/2023 14:30   DG Chest 2 View Result Date: 10/03/2023 CLINICAL DATA:  Fever. EXAM: CHEST - 2 VIEW COMPARISON:  None available. FINDINGS: Cardiomediastinal silhouette and pulmonary vasculature  are within normal limits. Lungs are clear. The RIGHT hemidiaphragm is elevated. IMPRESSION: No acute cardiopulmonary process. Electronically Signed   By: Aliene Lloyd M.D.   On: 10/03/2023 12:55     Procedures   Medications Ordered in the ED  lactated ringers  infusion (has no administration in time range)  lactated ringers  bolus 1,000 mL (1,000 mLs Intravenous Bolus from Bag 10/03/23 1353)    And  lactated ringers  bolus 1,000 mL (has no administration in time range)    And  lactated ringers  bolus 1,000 mL (1,000 mLs Intravenous Bolus from Bag 10/03/23 1323)  vancomycin  (VANCOREADY) IVPB 2000 mg/400 mL (2,000 mg Intravenous New Bag/Given 10/03/23 1356)  vancomycin  (VANCOREADY) IVPB 1250 mg/250 mL (has no administration in time range)  ceFEPIme  (MAXIPIME ) 2 g in sodium chloride  0.9 % 100 mL IVPB (0 g Intravenous Stopped 10/03/23 1353)  ondansetron  (ZOFRAN ) injection 4 mg (4 mg Intravenous Given 10/03/23 1354)  HYDROmorphone   (DILAUDID ) injection 1 mg (1 mg Intravenous Given 10/03/23 1354)    Clinical Course as of 10/03/23 1527  Wed Oct 03, 2023  1526 Dr Pearlean from hospitalist consulted for admission [RP]    Clinical Course User Index [RP] Yolande Lamar BROCKS, MD                                 Medical Decision Making Amount and/or Complexity of Data Reviewed Labs: ordered. Radiology: ordered.  Risk Prescription drug management. Decision regarding hospitalization.   53 year old male with history of hypertension and hyperlipidemia presents emergency department with fever and dysuria  Initial Ddx:  UTI, sepsis, prostatitis, STI, pyelonephritis, kidney stone, pneumonia, URI  MDM/Course:  Patient presents emergency department with dysuria that started yesterday.  Also been having some fevers as well.  No STI symptoms.  On arrival was tachycardic and at home was reported to have a fever of 104 before taking some Advil before coming in.  Physical exam is otherwise reassuring.  No abdominal tenderness palpation or prostate tenderness to palpation.  His urinalysis is consistent with UTI and he has white blood cell count of 17.  Sepsis protocol initiated.  He was started on broad-spectrum antibiotics because he was found to be in septic shock with a lactic acid above 5.  He was given IV fluids and upon re-evaluation was doing much better.  Cap refills less than 2 seconds.  Repeat lactate 2.5.  Heart rate is improved.  Had a CT scan which did not show evidence of kidney stone but did show that his prostate appeared to be inflamed.  No abscess noted.  Will admit to hospitalist for UTI and sepsis due to prostatitis.  This patient presents to the ED for concern of complaints listed in HPI, this involves an extensive number of treatment options, and is a complaint that carries with it a high risk of complications and morbidity. Disposition including potential need for admission considered.   Dispo: Admit to  Floor  Additional history obtained from spouse Records reviewed Outpatient Clinic Notes The following labs were independently interpreted: Urinalysis and show urinary tract infection I independently reviewed the following imaging with scope of interpretation limited to determining acute life threatening conditions related to emergency care: CT Abdomen/Pelvis and agree with the radiologist interpretation with the following exceptions: none I personally reviewed and interpreted cardiac monitoring: sinus tachycardia I personally reviewed and interpreted the pt's EKG: see above for interpretation  I have reviewed the patients home  medications and made adjustments as needed Consults: Hospitalist  Portions of this note were generated with Scientist, clinical (histocompatibility and immunogenetics). Dictation errors may occur despite best attempts at proofreading.    CRITICAL CARE Performed by: Lamar JAYSON Shan   Total critical care time: 30 minutes  Critical care time was exclusive of separately billable procedures and treating other patients.  Critical care was necessary to treat or prevent imminent or life-threatening deterioration.  Critical care was time spent personally by me on the following activities: development of treatment plan with patient and/or surrogate as well as nursing, discussions with consultants, evaluation of patient's response to treatment, examination of patient, obtaining history from patient or surrogate, ordering and performing treatments and interventions, ordering and review of laboratory studies, ordering and review of radiographic studies, pulse oximetry and re-evaluation of patient's condition.  Final diagnoses:  Urinary tract infection with hematuria, site unspecified  Acute prostatitis  Septic shock [facility])    ED Discharge Orders     None          Shan Lamar JAYSON, MD 10/03/23 1452

## 2023-10-04 DIAGNOSIS — A419 Sepsis, unspecified organism: Secondary | ICD-10-CM | POA: Diagnosis not present

## 2023-10-04 DIAGNOSIS — R6520 Severe sepsis without septic shock: Secondary | ICD-10-CM | POA: Diagnosis not present

## 2023-10-04 LAB — CBC
HCT: 35.8 % — ABNORMAL LOW (ref 39.0–52.0)
Hemoglobin: 11.4 g/dL — ABNORMAL LOW (ref 13.0–17.0)
MCH: 27.7 pg (ref 26.0–34.0)
MCHC: 31.8 g/dL (ref 30.0–36.0)
MCV: 86.9 fL (ref 80.0–100.0)
Platelets: 157 K/uL (ref 150–400)
RBC: 4.12 MIL/uL — ABNORMAL LOW (ref 4.22–5.81)
RDW: 14.8 % (ref 11.5–15.5)
WBC: 13.7 K/uL — ABNORMAL HIGH (ref 4.0–10.5)
nRBC: 0 % (ref 0.0–0.2)

## 2023-10-04 LAB — HIV ANTIBODY (ROUTINE TESTING W REFLEX): HIV Screen 4th Generation wRfx: NONREACTIVE

## 2023-10-04 LAB — BASIC METABOLIC PANEL WITH GFR
Anion gap: 8 (ref 5–15)
BUN: 11 mg/dL (ref 6–20)
CO2: 22 mmol/L (ref 22–32)
Calcium: 8.5 mg/dL — ABNORMAL LOW (ref 8.9–10.3)
Chloride: 106 mmol/L (ref 98–111)
Creatinine, Ser: 1.04 mg/dL (ref 0.61–1.24)
GFR, Estimated: >60 mL/min (ref >60)
Glucose, Bld: 123 mg/dL — ABNORMAL HIGH (ref 70–99)
Potassium: 4.2 mmol/L (ref 3.5–5.1)
Sodium: 136 mmol/L (ref 135–145)

## 2023-10-04 MED ORDER — MAGNESIUM CITRATE PO SOLN
1.0000 | Freq: Once | ORAL | Status: AC
  Administered 2023-10-04: 1 via ORAL
  Filled 2023-10-04: qty 296

## 2023-10-04 MED ORDER — SENNOSIDES-DOCUSATE SODIUM 8.6-50 MG PO TABS
1.0000 | ORAL_TABLET | Freq: Two times a day (BID) | ORAL | Status: DC
  Administered 2023-10-04 – 2023-10-06 (×4): 1 via ORAL
  Filled 2023-10-04 (×5): qty 1

## 2023-10-04 NOTE — Hospital Course (Signed)
 Jose Logan is a 53 y.o. male with medical history significant for hypertension. Patient presented to the ED with complaints of fever of 104 last night, and fever this morning, he took 2 doses of ibuprofen before he came to the ED.  He also reports lower abdominal pressure when he tried to urinate.  He reports he was shaking so bad that he felt like he was having some difficulty breathing-but that has improved now.  No cough.  Reports nausea without vomiting.   ED Course: Tmax 99.1.  Tachycardic heart rate 100-138.  Blood pressure systolic 105-113.  Respiratory rate 19-24.  O2 sats greater than 96% on room air. WBC 17.1.  Lactic acidosis 5.2 >> 2.5.  UA suggestive of UTI.  2 view chest x-ray clear. CT renal stone study-fat stranding surrounding the prostate and bilateral seminal vesicles-reflect sequelae of prostatitis.  Prostate not enlarged.  No evidence of obstructive uropathy or calculi. Blood and urine cultures obtained IV vancomycin  and cefepime  started.   3 L bolus given.  Hospitalist to admit

## 2023-10-04 NOTE — Plan of Care (Signed)
   Problem: Education: Goal: Knowledge of General Education information will improve Description Including pain rating scale, medication(s)/side effects and non-pharmacologic comfort measures Outcome: Progressing   Problem: Health Behavior/Discharge Planning: Goal: Ability to manage health-related needs will improve Outcome: Progressing

## 2023-10-04 NOTE — Progress Notes (Signed)
 PROGRESS NOTE    Patient: Jose Logan                            PCP: Toribio Jerel MATSU, MD                    DOB: 01-13-1971            DOA: 10/03/2023 FMW:[id number]             DOS: 10/04/2023, 8:01 AM   LOS: 1 day   Date of Service: The patient was seen and examined on 10/04/2023  Subjective:   The patient was seen and examined this morning. Afebrile, heart rate at 101, blood pressure stable at 111/62, satting 95% with Otherwise hemodynamically stable  Brief Narrative:   Alie Hardgrove is a 53 y.o. male with medical history significant for hypertension. Patient presented to the ED with complaints of fever of 104 last night, and fever this morning, he took 2 doses of ibuprofen before he came to the ED.  He also reports lower abdominal pressure when he tried to urinate.  He reports he was shaking so bad that he felt like he was having some difficulty breathing-but that has improved now.  No cough.  Reports nausea without vomiting.   ED Course: Tmax 99.1.  Tachycardic heart rate 100-138.  Blood pressure systolic 105-113.  Respiratory rate 19-24.  O2 sats greater than 96% on room air. WBC 17.1.  Lactic acidosis 5.2 >> 2.5.  UA suggestive of UTI.  2 view chest x-ray clear. CT renal stone study-fat stranding surrounding the prostate and bilateral seminal vesicles-reflect sequelae of prostatitis.  Prostate not enlarged.  No evidence of obstructive uropathy or calculi. Blood and urine cultures obtained IV vancomycin  and cefepime  started.   3 L bolus given.  Hospitalist to admit    Assessment & Plan:   Principal Problem:   Severe sepsis (HCC) Active Problems:   Prostatitis   HTN (hypertension)   Alcohol  use     Assessment and Plan: * Severe sepsis (HCC) -Patient remained stable Current vitals Blood pressure 111/62, pulse (!) 101, temperature 98.7 F (37.1 C),  RR 19, SpO2 95% on RA   - - POA:  Metsevere sepsis criteria with leukocytosis of 17.1, tachycardia heart rate  100-138, dyspnea respiratory 19-24, with evidence of endorgan dysfunction significant lactic acidosis of 5.2 >> 2.5.  Etiology likely prostatitis/urinary tract infection. - IV Vanco and IV cefepime  started, continue with IV cefepime  -Follow-up blood and urine cultures -3 L bolus given, continue N/s 125cc/hr x 20hrs  Prostatitis Prostatitis/UTI with severe sepsis.  CT renal stone study-  Fat stranding surrounding the prostate and bilateral seminal vesicles, which could reflect sequela of prostatitis. The prostate is not enlarged.  No evidence of obstructive uropathy or urinary tract calculi. -Plan per above   Alcohol  use Drinks 4-5 beers every other day.  Denies history of withdrawal. -CIWA as needed  Hypokalemia - K-3.3,   HTN (hypertension) Blood pressure 105-113. -Hold valsartan /HCTZ in the setting of severe sepsis, resume tomorrow     ------------------------------------------------------------------------------------------------------------------------------------------ Nutritional status:  The patient's BMI is: Body mass index is 35.13 kg/m. I agree with the assessment and plan as outlined --------------------------------------------------------------------------------------------------------------------------------------- Cultures; Blood Cultures x 2 >> Urine Culture  >>>    -------------------------------------------------------------------------------------------------------------------------------------------  DVT prophylaxis:     Code Status:   Code Status: Full Code  Family Communication: No family member present at bedside-  -Advance care  planning has been discussed.   Admission status:   Status is: Inpatient Remains inpatient appropriate because: Sepsis criteria, needing IV fluids, IV antibiotics, following cultures   Disposition: From  - home             Planning for discharge in 1-2 days   Procedures:   No admission procedures for hospital  encounter.   Antimicrobials:  Anti-infectives (From admission, onward)    Start     Dose/Rate Route Frequency Ordered Stop   10/04/23 1215  ceFEPIme  (MAXIPIME ) 2 g in sodium chloride  0.9 % 100 mL IVPB  Status:  Discontinued        2 g 200 mL/hr over 30 Minutes Intravenous Every 24 hours 10/03/23 1959 10/03/23 2003   10/04/23 0400  vancomycin  (VANCOREADY) IVPB 1250 mg/250 mL        1,250 mg 166.7 mL/hr over 90 Minutes Intravenous Every 12 hours 10/03/23 1259     10/04/23 0000  ceFEPIme  (MAXIPIME ) 2 g in sodium chloride  0.9 % 100 mL IVPB        2 g 200 mL/hr over 30 Minutes Intravenous Every 8 hours 10/03/23 2003     10/03/23 1300  vancomycin  (VANCOREADY) IVPB 2000 mg/400 mL        2,000 mg 200 mL/hr over 120 Minutes Intravenous  Once 10/03/23 1248 10/03/23 1607   10/03/23 1245  ceFEPIme  (MAXIPIME ) 2 g in sodium chloride  0.9 % 100 mL IVPB        2 g 200 mL/hr over 30 Minutes Intravenous  Once 10/03/23 1239 10/03/23 1353        Medication:   enoxaparin  (LOVENOX ) injection  50 mg Subcutaneous Q24H   folic acid   1 mg Oral Daily   multivitamin with minerals  1 tablet Oral Daily   pregabalin   200 mg Oral TID   rosuvastatin   10 mg Oral QODAY   thiamine   100 mg Oral Daily   Or   thiamine   100 mg Intravenous Daily    acetaminophen  **OR** acetaminophen , LORazepam  **OR** LORazepam , ondansetron  **OR** ondansetron  (ZOFRAN ) IV, polyethylene glycol, tapentadol  HCl   Objective:   Vitals:   10/03/23 2120 10/04/23 0009 10/04/23 0025 10/04/23 0544  BP:    111/62  Pulse:    (!) 101  Resp: 15 (!) 23 19   Temp: (!) 97.5 F (36.4 C)   98.7 F (37.1 C)  TempSrc: Oral   Oral  SpO2:    95%  Weight:      Height:        Intake/Output Summary (Last 24 hours) at 10/04/2023 0801 Last data filed at 10/04/2023 0703 Gross per 24 hour  Intake 4638.96 ml  Output 925 ml  Net 3713.96 ml   Filed Weights   10/03/23 1122 10/03/23 1958  Weight: 104.3 kg 107.9 kg     Physical examination:    General:  AAO x 3,  cooperative, no distress;   HEENT:  Normocephalic, PERRL, otherwise with in Normal limits   Neuro:  CNII-XII intact. , normal motor and sensation, reflexes intact   Lungs:   Clear to auscultation BL, Respirations unlabored,  No wheezes / crackles  Cardio:    S1/S2, RRR, No murmure, No Rubs or Gallops   Abdomen:  Soft, non-tender, bowel sounds active all four quadrants, no guarding or peritoneal signs.  Muscular  skeletal:  Limited exam -global generalized weaknesses - in bed, able to move all 4 extremities,   2+ pulses,  symmetric, No pitting edema  Skin:  Dry, warm to touch, negative for any Rashes,  Wounds: Please see nursing documentation       ------------------------------------------------------------------------------------------------------------------------------------------    LABs:     Latest Ref Rng & Units 10/04/2023    4:38 AM 10/03/2023   11:37 AM 09/04/2016   11:35 AM  CBC  WBC 4.0 - 10.5 K/uL 13.7  17.1  6.2   Hemoglobin 13.0 - 17.0 g/dL 88.5  86.3  85.2   Hematocrit 39.0 - 52.0 % 35.8  40.8  41.6   Platelets 150 - 400 K/uL 157  217  249       Latest Ref Rng & Units 10/04/2023    4:38 AM 10/03/2023   11:37 AM 01/25/2017   11:04 AM  CMP  Glucose 70 - 99 mg/dL 876  805    BUN 6 - 20 mg/dL 11  14    Creatinine 9.38 - 1.24 mg/dL 8.95  8.81    Sodium 864 - 145 mmol/L 136  132    Potassium 3.5 - 5.1 mmol/L 4.2  3.3    Chloride 98 - 111 mmol/L 106  102    CO2 22 - 32 mmol/L 22  15    Calcium  8.9 - 10.3 mg/dL 8.5  8.8    Total Protein 6.5 - 8.1 g/dL  7.2  7.8   Total Bilirubin 0.0 - 1.2 mg/dL  1.9    Alkaline Phos 38 - 126 U/L  98    AST 15 - 41 U/L  41    ALT 0 - 44 U/L  32         Micro Results Recent Results (from the past 240 hours)  Resp panel by RT-PCR (RSV, Flu A&B, Covid) Anterior Nasal Swab     Status: None   Collection Time: 10/03/23 11:25 AM   Specimen: Anterior Nasal Swab  Result Value Ref Range Status   SARS  Coronavirus 2 by RT PCR NEGATIVE NEGATIVE Final    Comment: (NOTE) SARS-CoV-2 target nucleic acids are NOT DETECTED.  The SARS-CoV-2 RNA is generally detectable in upper respiratory specimens during the acute phase of infection. The lowest concentration of SARS-CoV-2 viral copies this assay can detect is 138 copies/mL. A negative result does not preclude SARS-Cov-2 infection and should not be used as the sole basis for treatment or other patient management decisions. A negative result may occur with  improper specimen collection/handling, submission of specimen other than nasopharyngeal swab, presence of viral mutation(s) within the areas targeted by this assay, and inadequate number of viral copies(<138 copies/mL). A negative result must be combined with clinical observations, patient history, and epidemiological information. The expected result is Negative.  Fact Sheet for Patients:  BloggerCourse.com  Fact Sheet for Healthcare Providers:  SeriousBroker.it  This test is no t yet approved or cleared by the [place]  FDA and  has been authorized for detection and/or diagnosis of SARS-CoV-2 by FDA under an Emergency Use Authorization (EUA). This EUA will remain  in effect (meaning this test can be used) for the duration of the COVID-19 declaration under Section 564(b)(1) of the Act, 21 U.S.C.section 360bbb-3(b)(1), unless the authorization is terminated  or revoked sooner.       Influenza A by PCR NEGATIVE NEGATIVE Final   Influenza B by PCR NEGATIVE NEGATIVE Final    Comment: (NOTE) The Xpert Xpress SARS-CoV-2/FLU/RSV plus assay is intended as an aid in the diagnosis of influenza from Nasopharyngeal swab specimens and should not be used as a sole basis for treatment.  Nasal washings and aspirates are unacceptable for Xpert Xpress SARS-CoV-2/FLU/RSV testing.  Fact Sheet for  Patients: BloggerCourse.com  Fact Sheet for Healthcare Providers: SeriousBroker.it  This test is not yet approved or cleared by the [place]  FDA and has been authorized for detection and/or diagnosis of SARS-CoV-2 by FDA under an Emergency Use Authorization (EUA). This EUA will remain in effect (meaning this test can be used) for the duration of the COVID-19 declaration under Section 564(b)(1) of the Act, 21 U.S.C. section 360bbb-3(b)(1), unless the authorization is terminated or revoked.     Resp Syncytial Virus by PCR NEGATIVE NEGATIVE Final    Comment: (NOTE) Fact Sheet for Patients: BloggerCourse.com  Fact Sheet for Healthcare Providers: SeriousBroker.it  This test is not yet approved or cleared by the [place]  FDA and has been authorized for detection and/or diagnosis of SARS-CoV-2 by FDA under an Emergency Use Authorization (EUA). This EUA will remain in effect (meaning this test can be used) for the duration of the COVID-19 declaration under Section 564(b)(1) of the Act, 21 U.S.C. section 360bbb-3(b)(1), unless the authorization is terminated or revoked.  Performed at [facility], [address] [place], [place] [address]   Blood culture (routine x 2)     Status: None (Preliminary result)   Collection Time: 10/03/23  1:04 PM   Specimen: BLOOD  Result Value Ref Range Status   Specimen Description BLOOD BLOOD RIGHT HAND  Final   Special Requests   Final    BOTTLES DRAWN AEROBIC AND ANAEROBIC Blood Culture results may not be optimal due to an inadequate volume of blood received in culture bottles Performed at [facility], [address], [place], [place] [address]    Culture PENDING  Incomplete   Report Status PENDING  Incomplete  Blood culture (routine x 2)     Status: None (Preliminary result)   Collection Time: 10/03/23  1:55 PM   Specimen: BLOOD LEFT HAND   Result Value Ref Range Status   Specimen Description   Final    BLOOD LEFT HAND BOTTLES DRAWN AEROBIC AND ANAEROBIC   Special Requests   Final    Blood Culture results may not be optimal due to an inadequate volume of blood received in culture bottles Performed at [facility], [address] [place], [place] [address]    Culture PENDING  Incomplete   Report Status PENDING  Incomplete    Radiology Reports CT Renal Stone Study Result Date: 10/03/2023 CLINICAL DATA:  Fever, short of breath, nausea, dysuria, decreased urine output EXAM: CT ABDOMEN AND PELVIS WITHOUT CONTRAST TECHNIQUE: Multidetector CT imaging of the abdomen and pelvis was performed following the standard protocol without IV contrast. RADIATION DOSE REDUCTION: This exam was performed according to the departmental dose-optimization program which includes automated exposure control, adjustment of the mA and/or kV according to patient size and/or use of iterative reconstruction technique. COMPARISON:  None Available. FINDINGS: Lower chest: No acute pleural or parenchymal lung disease. Hepatobiliary: Decreased liver attenuation consistent with hepatic steatosis. Gallbladder is unremarkable. No biliary duct dilation. Pancreas: Unremarkable unenhanced appearance. Spleen: Unremarkable unenhanced appearance. Adrenals/Urinary Tract: No urinary tract calculi or obstructive uropathy within either kidney. The adrenals are unremarkable. The bladder is decompressed, limiting its evaluation. Stomach/Bowel: No bowel obstruction or ileus. Normal appendix right lower quadrant. Scattered distal colonic diverticulosis without evidence of acute diverticulitis. Vascular/Lymphatic: Aortic atherosclerosis. No enlarged abdominal or pelvic lymph nodes. Reproductive: The prostate is not enlarged. There is mild fat stranding surrounding the prostate and seminal vesicles, which  could reflect underlying prostatitis. Other: No free fluid or free intraperitoneal gas. No  abdominal wall hernia. Musculoskeletal: No acute or destructive bony abnormalities. Reconstructed images demonstrate no additional findings. IMPRESSION: 1. Fat stranding surrounding the prostate and bilateral seminal vesicles, which could reflect sequela of prostatitis. The prostate is not enlarged. 2. No evidence of urinary tract calculi or obstructive uropathy. 3. Distal colonic diverticulosis without diverticulitis. 4.  Aortic Atherosclerosis (ICD10-I70.0). 5. Hepatic steatosis. Electronically Signed   By: Ozell Daring M.D.   On: 10/03/2023 14:30   DG Chest 2 View Result Date: 10/03/2023 CLINICAL DATA:  Fever. EXAM: CHEST - 2 VIEW COMPARISON:  None available. FINDINGS: Cardiomediastinal silhouette and pulmonary vasculature are within normal limits. Lungs are clear. The RIGHT hemidiaphragm is elevated. IMPRESSION: No acute cardiopulmonary process. Electronically Signed   By: Aliene Lloyd M.D.   On: 10/03/2023 12:55    SIGNED: Adriana DELENA Grams, MD, FHM. FAAFP. Jolynn Pack - [facility] Time spent - 55 min.  In seeing, evaluating and examining the patient. Reviewing medical records, labs, drawn plan of care. [facility],  Pager (please use [email or web address] to page/ text) Please use Epic Secure Chat for non-urgent communication (7AM-7PM)  If 7PM-7AM, please contact night-coverage [email or web address], 10/04/2023, 8:01 AM

## 2023-10-04 NOTE — Plan of Care (Signed)
  Problem: Education: Goal: Knowledge of General Education information will improve Description: Including pain rating scale, medication(s)/side effects and non-pharmacologic comfort measures Outcome: Progressing   Problem: Coping: Goal: Level of anxiety will decrease Outcome: Progressing   Problem: Elimination: Goal: Will not experience complications related to bowel motility Outcome: Progressing Goal: Will not experience complications related to urinary retention Outcome: Progressing   Problem: Pain Managment: Goal: General experience of comfort will improve and/or be controlled Outcome: Progressing   Problem: Safety: Goal: Ability to remain free from injury will improve Outcome: Progressing

## 2023-10-05 DIAGNOSIS — R6520 Severe sepsis without septic shock: Secondary | ICD-10-CM | POA: Diagnosis not present

## 2023-10-05 DIAGNOSIS — A419 Sepsis, unspecified organism: Secondary | ICD-10-CM | POA: Diagnosis not present

## 2023-10-05 LAB — COMPREHENSIVE METABOLIC PANEL WITH GFR
ALT: 34 U/L (ref 0–44)
AST: 35 U/L (ref 15–41)
Albumin: 3.2 g/dL — ABNORMAL LOW (ref 3.5–5.0)
Alkaline Phosphatase: 77 U/L (ref 38–126)
Anion gap: 11 (ref 5–15)
BUN: 10 mg/dL (ref 6–20)
CO2: 22 mmol/L (ref 22–32)
Calcium: 8.1 mg/dL — ABNORMAL LOW (ref 8.9–10.3)
Chloride: 103 mmol/L (ref 98–111)
Creatinine, Ser: 0.98 mg/dL (ref 0.61–1.24)
GFR, Estimated: >60 mL/min (ref >60)
Glucose, Bld: 140 mg/dL — ABNORMAL HIGH (ref 70–99)
Potassium: 3.4 mmol/L — ABNORMAL LOW (ref 3.5–5.1)
Sodium: 136 mmol/L (ref 135–145)
Total Bilirubin: 0.7 mg/dL (ref 0.0–1.2)
Total Protein: 6.5 g/dL (ref 6.5–8.1)

## 2023-10-05 LAB — CBC
HCT: 36.9 % — ABNORMAL LOW (ref 39.0–52.0)
Hemoglobin: 11.4 g/dL — ABNORMAL LOW (ref 13.0–17.0)
MCH: 27.2 pg (ref 26.0–34.0)
MCHC: 30.9 g/dL (ref 30.0–36.0)
MCV: 88.1 fL (ref 80.0–100.0)
Platelets: 168 K/uL (ref 150–400)
RBC: 4.19 MIL/uL — ABNORMAL LOW (ref 4.22–5.81)
RDW: 14.7 % (ref 11.5–15.5)
WBC: 7.9 K/uL (ref 4.0–10.5)
nRBC: 0 % (ref 0.0–0.2)

## 2023-10-05 LAB — URINE CULTURE: Culture: >=100000 — AB

## 2023-10-05 MED ORDER — POTASSIUM CHLORIDE CRYS ER 20 MEQ PO TBCR
40.0000 meq | EXTENDED_RELEASE_TABLET | Freq: Once | ORAL | Status: AC
  Administered 2023-10-05: 40 meq via ORAL
  Filled 2023-10-05: qty 2

## 2023-10-05 MED ORDER — IRBESARTAN 150 MG PO TABS
150.0000 mg | ORAL_TABLET | Freq: Every day | ORAL | Status: DC
  Administered 2023-10-05 – 2023-10-06 (×2): 150 mg via ORAL
  Filled 2023-10-05 (×2): qty 1

## 2023-10-05 MED ORDER — VALSARTAN-HYDROCHLOROTHIAZIDE 160-12.5 MG PO TABS: 1.0000 | ORAL_TABLET | Freq: Every day | ORAL | Status: DC

## 2023-10-05 MED ORDER — SODIUM CHLORIDE 0.9 % IV SOLN
1.0000 g | INTRAVENOUS | Status: DC
  Administered 2023-10-05 – 2023-10-06 (×2): 1 g via INTRAVENOUS
  Filled 2023-10-05 (×3): qty 10

## 2023-10-05 MED ORDER — OXYCODONE HCL 5 MG PO TABS
10.0000 mg | ORAL_TABLET | Freq: Four times a day (QID) | ORAL | Status: DC | PRN
  Administered 2023-10-05: 10 mg via ORAL
  Filled 2023-10-05: qty 2

## 2023-10-05 MED ORDER — HYDROCHLOROTHIAZIDE 12.5 MG PO TABS
12.5000 mg | ORAL_TABLET | Freq: Every day | ORAL | Status: DC
  Administered 2023-10-05 – 2023-10-06 (×2): 12.5 mg via ORAL
  Filled 2023-10-05 (×2): qty 1

## 2023-10-05 NOTE — Progress Notes (Signed)
 PROGRESS NOTE    Patient: Jose Logan                            PCP: Toribio Jerel MATSU, MD                    DOB: [birth year]            DOA: 10/03/2023 FMW:[id number]             DOS: 10/05/2023, 11:23 AM   LOS: 2 days   Date of Service: The patient was seen and examined on 10/05/2023  Subjective:   The patient was seen and examined this morning. Afebrile, heart rate at 101, blood pressure stable at 111/62, satting 95% with Otherwise hemodynamically stable  Brief Narrative:   Jose Logan is a 53 y.o. male with medical history significant for hypertension. Patient presented to the ED with complaints of fever of 104 last night, and fever this morning, he took 2 doses of ibuprofen before he came to the ED.  He also reports lower abdominal pressure when he tried to urinate.  He reports he was shaking so bad that he felt like he was having some difficulty breathing-but that has improved now.  No cough.  Reports nausea without vomiting.   ED Course: Tmax 99.1.  Tachycardic heart rate 100-138.  Blood pressure systolic 105-113.  Respiratory rate 19-24.  O2 sats greater than 96% on room air. WBC 17.1.  Lactic acidosis 5.2 >> 2.5.  UA suggestive of UTI.  2 view chest x-ray clear. CT renal stone study-fat stranding surrounding the prostate and bilateral seminal vesicles-reflect sequelae of prostatitis.  Prostate not enlarged.  No evidence of obstructive uropathy or calculi. Blood and urine cultures obtained IV vancomycin  and cefepime  started.   3 L bolus given.  Hospitalist to admit    Assessment & Plan:   Principal Problem:   Severe sepsis (HCC) Active Problems:   Prostatitis   HTN (hypertension)   Alcohol  use     Assessment and Plan: * Severe sepsis (HCC) -likely due to UTI/prostatitis --Improving sepsis physiology Today's vitals Blood pressure (!) 145/92, pulse 82, temperature 98.2 F (36.8 C), RR 19, SpO2 96% on room air  -Urine culture > 100 K colonies of E. Coli,  resistant to ampicillin/sulbactam - Discontinuing vancomycin /cefepime , initiating IV Rocephin  today 10/05/2023 - If remains stable will switch to p.o. ciprofloxacin a.m. Anticipating prolonged antibiotics treatment for sepsis/prostatitis  - POA:  Metsevere sepsis criteria with leukocytosis of 17.1, tachycardia heart rate 100-138, dyspnea respiratory 19-24, with evidence of endorgan dysfunction significant lactic acidosis of 5.2 >> 2.5.  Etiology likely prostatitis/urinary tract infection.   -3 L bolus given, continue N/s 125cc/hr x 20hrs --will discontinue, tolerating p.o.  Prostatitis Prostatitis/UTI with severe sepsis.  Improved sepsis physiology  CT renal stone study-  Fat stranding surrounding the prostate and bilateral seminal vesicles, which could reflect sequela of prostatitis. The prostate is not enlarged.  No evidence of obstructive uropathy or urinary tract calculi. -Plan per above   Alcohol  use Drinks 4-5 beers every other day.  Denies history of withdrawal. -CIWA as needed  Hypokalemia - K-3.3, 3.4 Monitoring and replating  HTN (hypertension) Blood pressure 105-113. -was Hold valsartan /HCTZ in the setting of severe sepsis, will resume now     ------------------------------------------------------------------------------------------------------------------ Nutritional status:  The patient's BMI is: Body mass index is 35.13 kg/m. I agree with the assessment and plan as outlined ------------------------------------------------------------------------------------------------------------------ Cultures; Blood Cultures  x 2 >> no growth to date Urine Culture  >>> E. coli, resistant ampicillin and sulbactam otherwise pansensitive   ------------------------------------------------------------------------------------------------------------------  DVT prophylaxis:  Lovenox    Code Status:   Code Status: Full Code  Family Communication: No family member present at  bedside-  -Advance care planning has been discussed.   Admission status:   Status is: Inpatient Remains inpatient appropriate because: Sepsis criteria, needing IV fluids, IV antibiotics, following cultures   Disposition: From  - home             Planning for discharge in 1-2 days   Procedures:   No admission procedures for hospital encounter.   Antimicrobials:  Anti-infectives (From admission, onward)    Start     Dose/Rate Route Frequency Ordered Stop   10/05/23 1600  cefTRIAXone  (ROCEPHIN ) 1 g in sodium chloride  0.9 % 100 mL IVPB        1 g 200 mL/hr over 30 Minutes Intravenous Every 24 hours 10/05/23 1012     10/04/23 1215  ceFEPIme  (MAXIPIME ) 2 g in sodium chloride  0.9 % 100 mL IVPB  Status:  Discontinued        2 g 200 mL/hr over 30 Minutes Intravenous Every 24 hours 10/03/23 1959 10/03/23 2003   10/04/23 0400  vancomycin  (VANCOREADY) IVPB 1250 mg/250 mL  Status:  Discontinued        1,250 mg 166.7 mL/hr over 90 Minutes Intravenous Every 12 hours 10/03/23 1259 10/05/23 0816   10/04/23 0000  ceFEPIme  (MAXIPIME ) 2 g in sodium chloride  0.9 % 100 mL IVPB  Status:  Discontinued        2 g 200 mL/hr over 30 Minutes Intravenous Every 8 hours 10/03/23 2003 10/05/23 1012   10/03/23 1300  vancomycin  (VANCOREADY) IVPB 2000 mg/400 mL        2,000 mg 200 mL/hr over 120 Minutes Intravenous  Once 10/03/23 1248 10/03/23 1607   10/03/23 1245  ceFEPIme  (MAXIPIME ) 2 g in sodium chloride  0.9 % 100 mL IVPB        2 g 200 mL/hr over 30 Minutes Intravenous  Once 10/03/23 1239 10/03/23 1353        Medication:   enoxaparin  (LOVENOX ) injection  50 mg Subcutaneous Q24H   folic acid   1 mg Oral Daily   multivitamin with minerals  1 tablet Oral Daily   pregabalin   200 mg Oral TID   rosuvastatin   10 mg Oral QODAY   senna-docusate  1 tablet Oral BID   thiamine   100 mg Oral Daily   Or   thiamine   100 mg Intravenous Daily    acetaminophen  **OR** acetaminophen , LORazepam  **OR** LORazepam ,  ondansetron  **OR** ondansetron  (ZOFRAN ) IV, polyethylene glycol, tapentadol  HCl   Objective:   Vitals:   10/04/23 0544 10/04/23 1435 10/04/23 2013 10/05/23 0616  BP: 111/62 118/72 (!) 141/86 (!) 145/92  Pulse: (!) 101 85 89 82  Resp:  16  19  Temp: 98.7 F (37.1 C) 97.6 F (36.4 C) 98.2 F (36.8 C) 98.2 F (36.8 C)  TempSrc: Oral Oral Oral Oral  SpO2: 95% 97% 95% 96%  Weight:      Height:        Intake/Output Summary (Last 24 hours) at 10/05/2023 1123 Last data filed at 10/05/2023 1119 Gross per 24 hour  Intake 240 ml  Output 950 ml  Net -710 ml   Filed Weights   10/03/23 1122 10/03/23 1958  Weight: 104.3 kg 107.9 kg     Physical examination:  General:  AAO x 3,  cooperative, no distress;   HEENT:  Normocephalic, PERRL, otherwise with in Normal limits   Neuro:  CNII-XII intact. , normal motor and sensation, reflexes intact   Lungs:   Clear to auscultation BL, Respirations unlabored,  No wheezes / crackles  Cardio:    S1/S2, RRR, No murmure, No Rubs or Gallops   Abdomen:  Soft, non-tender, bowel sounds active all four quadrants, no guarding or peritoneal signs.  Muscular  skeletal:  Limited exam -global generalized weaknesses - in bed, able to move all 4 extremities,   2+ pulses,  symmetric, No pitting edema  Skin:  Dry, warm to touch, negative for any Rashes,  Wounds: Please see nursing documentation    ------------------------------------------------------------------------------------------------------------------    LABs:     Latest Ref Rng & Units 10/05/2023    4:33 AM 10/04/2023    4:38 AM 10/03/2023   11:37 AM  CBC  WBC 4.0 - 10.5 K/uL 7.9  13.7  17.1   Hemoglobin 13.0 - 17.0 g/dL 88.5  88.5  86.3   Hematocrit 39.0 - 52.0 % 36.9  35.8  40.8   Platelets 150 - 400 K/uL 168  157  217       Latest Ref Rng & Units 10/05/2023    4:33 AM 10/04/2023    4:38 AM 10/03/2023   11:37 AM  CMP  Glucose 70 - 99 mg/dL 859  876  805   BUN 6 - 20 mg/dL 10  11  14     Creatinine 0.61 - 1.24 mg/dL 9.01  8.95  8.81   Sodium 135 - 145 mmol/L 136  136  132   Potassium 3.5 - 5.1 mmol/L 3.4  4.2  3.3   Chloride 98 - 111 mmol/L 103  106  102   CO2 22 - 32 mmol/L 22  22  15    Calcium  8.9 - 10.3 mg/dL 8.1  8.5  8.8   Total Protein 6.5 - 8.1 g/dL 6.5   7.2   Total Bilirubin 0.0 - 1.2 mg/dL 0.7   1.9   Alkaline Phos 38 - 126 U/L 77   98   AST 15 - 41 U/L 35   41   ALT 0 - 44 U/L 34   32        Micro Results Recent Results (from the past 240 hours)  Resp panel by RT-PCR (RSV, Flu A&B, Covid) Anterior Nasal Swab     Status: None   Collection Time: 10/03/23 11:25 AM   Specimen: Anterior Nasal Swab  Result Value Ref Range Status   SARS Coronavirus 2 by RT PCR NEGATIVE NEGATIVE Final    Comment: (NOTE) SARS-CoV-2 target nucleic acids are NOT DETECTED.  The SARS-CoV-2 RNA is generally detectable in upper respiratory specimens during the acute phase of infection. The lowest concentration of SARS-CoV-2 viral copies this assay can detect is 138 copies/mL. A negative result does not preclude SARS-Cov-2 infection and should not be used as the sole basis for treatment or other patient management decisions. A negative result may occur with  improper specimen collection/handling, submission of specimen other than nasopharyngeal swab, presence of viral mutation(s) within the areas targeted by this assay, and inadequate number of viral copies(<138 copies/mL). A negative result must be combined with clinical observations, patient history, and epidemiological information. The expected result is Negative.  Fact Sheet for Patients:  BloggerCourse.com  Fact Sheet for Healthcare Providers:  SeriousBroker.it  This test is no t yet approved or  cleared by the [place]  FDA and  has been authorized for detection and/or diagnosis of SARS-CoV-2 by FDA under an Emergency Use Authorization (EUA). This EUA will remain   in effect (meaning this test can be used) for the duration of the COVID-19 declaration under Section 564(b)(1) of the Act, 21 U.S.C.section 360bbb-3(b)(1), unless the authorization is terminated  or revoked sooner.       Influenza A by PCR NEGATIVE NEGATIVE Final   Influenza B by PCR NEGATIVE NEGATIVE Final    Comment: (NOTE) The Xpert Xpress SARS-CoV-2/FLU/RSV plus assay is intended as an aid in the diagnosis of influenza from Nasopharyngeal swab specimens and should not be used as a sole basis for treatment. Nasal washings and aspirates are unacceptable for Xpert Xpress SARS-CoV-2/FLU/RSV testing.  Fact Sheet for Patients: BloggerCourse.com  Fact Sheet for Healthcare Providers: SeriousBroker.it  This test is not yet approved or cleared by the [place]  FDA and has been authorized for detection and/or diagnosis of SARS-CoV-2 by FDA under an Emergency Use Authorization (EUA). This EUA will remain in effect (meaning this test can be used) for the duration of the COVID-19 declaration under Section 564(b)(1) of the Act, 21 U.S.C. section 360bbb-3(b)(1), unless the authorization is terminated or revoked.     Resp Syncytial Virus by PCR NEGATIVE NEGATIVE Final    Comment: (NOTE) Fact Sheet for Patients: BloggerCourse.com  Fact Sheet for Healthcare Providers: SeriousBroker.it  This test is not yet approved or cleared by the [place]  FDA and has been authorized for detection and/or diagnosis of SARS-CoV-2 by FDA under an Emergency Use Authorization (EUA). This EUA will remain in effect (meaning this test can be used) for the duration of the COVID-19 declaration under Section 564(b)(1) of the Act, 21 U.S.C. section 360bbb-3(b)(1), unless the authorization is terminated or revoked.  Performed at [facility], [address], [place], [place] [address]   Urine Culture      Status: Abnormal   Collection Time: 10/03/23 12:32 PM   Specimen: Urine, Random  Result Value Ref Range Status   Specimen Description   Final    URINE, RANDOM Performed at [facility], [address], [place], [place] [address]    Special Requests   Final    NONE Reflexed from [id number] Performed at [facility], [address] [place], [place] [address]    Culture >=100,000 COLONIES/mL ESCHERICHIA COLI (A)  Final   Report Status 10/05/2023 FINAL  Final   Organism ID, Bacteria ESCHERICHIA COLI (A)  Final      Susceptibility   Escherichia coli - MIC*    AMPICILLIN >=32 RESISTANT Resistant     CEFAZOLIN  16 SENSITIVE Sensitive     CEFEPIME  <=0.12 SENSITIVE Sensitive     CEFTRIAXONE  <=0.25 SENSITIVE Sensitive     CIPROFLOXACIN <=0.25 SENSITIVE Sensitive     GENTAMICIN <=1 SENSITIVE Sensitive     IMIPENEM <=0.25 SENSITIVE Sensitive     NITROFURANTOIN <=16 SENSITIVE Sensitive     TRIMETH/SULFA <=20 SENSITIVE Sensitive     AMPICILLIN/SULBACTAM >=32 RESISTANT Resistant     PIP/TAZO <=4 SENSITIVE Sensitive ug/mL    * >=100,000 COLONIES/mL ESCHERICHIA COLI  Blood culture (routine x 2)     Status: None (Preliminary result)   Collection Time: 10/03/23  1:04 PM   Specimen: BLOOD  Result Value Ref Range Status   Specimen Description BLOOD BLOOD RIGHT HAND  Final   Special Requests   Final    BOTTLES DRAWN AEROBIC AND ANAEROBIC Blood Culture results may not  be optimal due to an inadequate volume of blood received in culture bottles   Culture   Final    NO GROWTH 2 DAYS Performed at [facility], [address], [place], [place] [address]    Report Status PENDING  Incomplete  Blood culture (routine x 2)     Status: None (Preliminary result)   Collection Time: 10/03/23  1:55 PM   Specimen: BLOOD LEFT HAND  Result Value Ref Range Status   Specimen Description   Final    BLOOD LEFT HAND BOTTLES DRAWN AEROBIC AND ANAEROBIC   Special Requests   Final    Blood Culture results may not be optimal  due to an inadequate volume of blood received in culture bottles   Culture   Final    NO GROWTH 2 DAYS Performed at [facility], [address], [place], [place] [address]    Report Status PENDING  Incomplete    Radiology Reports No results found.   SIGNED: Adriana DELENA Grams, MD, FHM. FAAFP. Jolynn Pack - [facility] Time spent - 55 min.  In seeing, evaluating and examining the patient. Reviewing medical records, labs, drawn plan of care. [facility],  Pager (please use [email or web address] to page/ text) Please use Epic Secure Chat for non-urgent communication (7AM-7PM)  If 7PM-7AM, please contact night-coverage [email or web address], 10/05/2023, 11:23 AM

## 2023-10-05 NOTE — Plan of Care (Signed)
   Problem: Education: Goal: Knowledge of General Education information will improve Description Including pain rating scale, medication(s)/side effects and non-pharmacologic comfort measures Outcome: Progressing   Problem: Health Behavior/Discharge Planning: Goal: Ability to manage health-related needs will improve Outcome: Progressing

## 2023-10-06 DIAGNOSIS — R6520 Severe sepsis without septic shock: Secondary | ICD-10-CM | POA: Diagnosis not present

## 2023-10-06 DIAGNOSIS — A419 Sepsis, unspecified organism: Secondary | ICD-10-CM | POA: Diagnosis not present

## 2023-10-06 LAB — CBC
HCT: 41.6 % (ref 39.0–52.0)
Hemoglobin: 13.4 g/dL (ref 13.0–17.0)
MCH: 27.7 pg (ref 26.0–34.0)
MCHC: 32.2 g/dL (ref 30.0–36.0)
MCV: 86 fL (ref 80.0–100.0)
Platelets: 216 K/uL (ref 150–400)
RBC: 4.84 MIL/uL (ref 4.22–5.81)
RDW: 14.4 % (ref 11.5–15.5)
WBC: 7 K/uL (ref 4.0–10.5)
nRBC: 0 % (ref 0.0–0.2)

## 2023-10-06 LAB — COMPREHENSIVE METABOLIC PANEL WITH GFR
ALT: 60 U/L — ABNORMAL HIGH (ref 0–44)
AST: 57 U/L — ABNORMAL HIGH (ref 15–41)
Albumin: 3.7 g/dL (ref 3.5–5.0)
Alkaline Phosphatase: 92 U/L (ref 38–126)
Anion gap: 12 (ref 5–15)
BUN: 14 mg/dL (ref 6–20)
CO2: 25 mmol/L (ref 22–32)
Calcium: 9.2 mg/dL (ref 8.9–10.3)
Chloride: 101 mmol/L (ref 98–111)
Creatinine, Ser: 0.96 mg/dL (ref 0.61–1.24)
GFR, Estimated: >60 mL/min (ref >60)
Glucose, Bld: 183 mg/dL — ABNORMAL HIGH (ref 70–99)
Potassium: 4 mmol/L (ref 3.5–5.1)
Sodium: 138 mmol/L (ref 135–145)
Total Bilirubin: 0.6 mg/dL (ref 0.0–1.2)
Total Protein: 7.5 g/dL (ref 6.5–8.1)

## 2023-10-06 MED ORDER — LACTINEX PO CHEW: 1.0000 | CHEWABLE_TABLET | Freq: Three times a day (TID) | ORAL | 0 refills | Status: AC

## 2023-10-06 MED ORDER — PHENAZOPYRIDINE HCL 95 MG PO TABS: 95.0000 mg | ORAL_TABLET | Freq: Three times a day (TID) | ORAL | 0 refills | Status: DC | PRN

## 2023-10-06 MED ORDER — CIPROFLOXACIN HCL 500 MG PO TABS: 500.0000 mg | ORAL_TABLET | Freq: Two times a day (BID) | ORAL | 0 refills | Status: AC

## 2023-10-06 MED ORDER — ACETAMINOPHEN 325 MG PO TABS: 650.0000 mg | ORAL_TABLET | Freq: Four times a day (QID) | ORAL | 0 refills | Status: DC | PRN

## 2023-10-06 NOTE — Plan of Care (Signed)
   Problem: Education: Goal: Knowledge of General Education information will improve Description Including pain rating scale, medication(s)/side effects and non-pharmacologic comfort measures Outcome: Progressing   Problem: Health Behavior/Discharge Planning: Goal: Ability to manage health-related needs will improve Outcome: Progressing

## 2023-10-06 NOTE — Discharge Summary (Signed)
 Physician Discharge Summary   Patient: Jose Logan MRN: [id number] DOB: 1970/10/22  Admit date:     10/03/2023  Discharge date: 10/06/23  Discharge Physician: Adriana DELENA Grams   PCP: Toribio Jerel MATSU, MD    Follow with the PCP in 1-2 weeks Follow-up with the urologist in 2 weeks Continue current course of antibiotics  Discharge Diagnoses: Principal Problem:   Severe sepsis [facility]) Active Problems:   Prostatitis   HTN (hypertension)   Alcohol  use    Hospital Course: Tal Kempker is a 53 y.o. male with medical history significant for hypertension. Patient presented to the ED with complaints of fever of 104 last night, and fever this morning, he took 2 doses of ibuprofen before he came to the ED.  He also reports lower abdominal pressure when he tried to urinate.  He reports he was shaking so bad that he felt like he was having some difficulty breathing-but that has improved now.  No cough.  Reports nausea without vomiting.   ED Course: Tmax 99.1.  Tachycardic heart rate 100-138.  Blood pressure systolic 105-113.  Respiratory rate 19-24.  O2 sats greater than 96% on room air. WBC 17.1.  Lactic acidosis 5.2 >> 2.5.  UA suggestive of UTI.  2 view chest x-ray clear. CT renal stone study-fat stranding surrounding the prostate and bilateral seminal vesicles-reflect sequelae of prostatitis.  Prostate not enlarged.  No evidence of obstructive uropathy or calculi. Blood and urine cultures obtained IV vancomycin  and cefepime  started.   3 L bolus given.  Hospitalist to admit  Severe sepsis (HCC) -Resolved sepsis physiology -Urine culture positive for E. coli, resistant to ampicillin, sulbactam - Patient was initially on vancomycin  and cefepime  then Rocephin  - Will discharge patient on p.o. ciprofloxacin  for 7 more days  - POA:  Metsevere sepsis criteria with leukocytosis of 17.1, tachycardia heart rate 100-138, dyspnea respiratory 19-24, with evidence of endorgan dysfunction significant  lactic acidosis of 5.2 >> 2.5.  Etiology likely prostatitis/urinary tract infection. - IV Vanco and IV cefepime  started, continue with IV cefepime  -Blood cultures no growth to date - S/p 3 L of bolus IV fluids, with maintenance fluid-discontinued   Prostatitis Prostatitis/UTI with severe sepsis.  CT renal stone study-  Fat stranding surrounding the prostate and bilateral seminal vesicles, which could reflect sequela of prostatitis. The prostate is not enlarged.  No evidence of obstructive uropathy or urinary tract calculi. - Posttreatment with antibiotics patient is to follow-up with urologist for further evaluation     Alcohol  use Drinks 4-5 beers every other day.  Denies history of withdrawal. -CIWA as needed   Hypokalemia - K-3.3,    HTN (hypertension) Blood pressure stable, -Recommend to continue hold valsartan /HCTZ for now       Procedures performed: CT abdomen/pelvis Disposition: Home Diet recommendation:  Discharge Diet Orders (From admission, onward)     Start     Ordered   10/06/23 0000  Diet - low sodium heart healthy        10/06/23 1033           Cardiac diet DISCHARGE MEDICATION: Allergies as of 10/06/2023       Reactions   Tetracyclines & Related Rash        Medication List     PAUSE taking these medications    valsartan -hydrochlorothiazide  160-12.5 MG tablet Wait to take this until: October 12, 2023 Commonly known as: DIOVAN -HCT Take 1 tablet by mouth daily.       TAKE these medications  acetaminophen  325 MG tablet Commonly known as: TYLENOL  Take 2 tablets (650 mg total) by mouth every 6 (six) hours as needed for mild pain (pain score 1-3) or fever (or Fever >/= 101).   ciprofloxacin  500 MG tablet Commonly known as: Cipro  Take 1 tablet (500 mg total) by mouth 2 (two) times daily for 7 days. Start taking on: October 07, 2023   fluticasone 50 MCG/ACT nasal spray Commonly known as: FLONASE Place 1 spray into both nostrils daily.    ibuprofen 200 MG tablet Commonly known as: ADVIL Take 400 mg by mouth every 6 (six) hours as needed for moderate pain (pain score 4-6).   lactobacillus acidophilus & bulgar chewable tablet Chew 1 tablet by mouth 3 (three) times daily with meals for 10 days.   Nucynta  75 MG tablet Generic drug: tapentadol  HCl Take 75 mg by mouth 3 (three) times daily as needed.   phenazopyridine  95 MG tablet Commonly known as: PYRIDIUM  Take 1 tablet (95 mg total) by mouth 3 (three) times daily as needed for pain.   pregabalin  200 MG capsule Commonly known as: LYRICA  Take 200 mg by mouth 3 (three) times daily.   rosuvastatin  10 MG tablet Commonly known as: CRESTOR  Take 10 mg by mouth every other day.   traZODone 50 MG tablet Commonly known as: DESYREL Take 50 mg by mouth at bedtime as needed.        Discharge Exam: Filed Weights   10/03/23 1122 10/03/23 1958  Weight: 104.3 kg 107.9 kg        General:  AAO x 3,  cooperative, no distress;   HEENT:  Normocephalic, PERRL, otherwise with in Normal limits   Neuro:  CNII-XII intact. , normal motor and sensation, reflexes intact   Lungs:   Clear to auscultation BL, Respirations unlabored,  No wheezes / crackles  Cardio:    S1/S2, RRR, No murmure, No Rubs or Gallops   Abdomen:  Soft, non-tender, bowel sounds active all four quadrants, no guarding or peritoneal signs.  Muscular  skeletal:  Limited exam -global generalized weaknesses - in bed, able to move all 4 extremities,   2+ pulses,  symmetric, No pitting edema  Skin:  Dry, warm to touch, negative for any Rashes,  Wounds: Please see nursing documentation          Condition at discharge: good  The results of significant diagnostics from this hospitalization (including imaging, microbiology, ancillary and laboratory) are listed below for reference.   Imaging Studies: CT Renal Stone Study Result Date: 10/03/2023 CLINICAL DATA:  Fever, short of breath, nausea, dysuria, decreased  urine output EXAM: CT ABDOMEN AND PELVIS WITHOUT CONTRAST TECHNIQUE: Multidetector CT imaging of the abdomen and pelvis was performed following the standard protocol without IV contrast. RADIATION DOSE REDUCTION: This exam was performed according to the departmental dose-optimization program which includes automated exposure control, adjustment of the mA and/or kV according to patient size and/or use of iterative reconstruction technique. COMPARISON:  None Available. FINDINGS: Lower chest: No acute pleural or parenchymal lung disease. Hepatobiliary: Decreased liver attenuation consistent with hepatic steatosis. Gallbladder is unremarkable. No biliary duct dilation. Pancreas: Unremarkable unenhanced appearance. Spleen: Unremarkable unenhanced appearance. Adrenals/Urinary Tract: No urinary tract calculi or obstructive uropathy within either kidney. The adrenals are unremarkable. The bladder is decompressed, limiting its evaluation. Stomach/Bowel: No bowel obstruction or ileus. Normal appendix right lower quadrant. Scattered distal colonic diverticulosis without evidence of acute diverticulitis. Vascular/Lymphatic: Aortic atherosclerosis. No enlarged abdominal or pelvic lymph nodes. Reproductive: The prostate is  not enlarged. There is mild fat stranding surrounding the prostate and seminal vesicles, which could reflect underlying prostatitis. Other: No free fluid or free intraperitoneal gas. No abdominal wall hernia. Musculoskeletal: No acute or destructive bony abnormalities. Reconstructed images demonstrate no additional findings. IMPRESSION: 1. Fat stranding surrounding the prostate and bilateral seminal vesicles, which could reflect sequela of prostatitis. The prostate is not enlarged. 2. No evidence of urinary tract calculi or obstructive uropathy. 3. Distal colonic diverticulosis without diverticulitis. 4.  Aortic Atherosclerosis (ICD10-I70.0). 5. Hepatic steatosis. Electronically Signed   By: Ozell Daring M.D.    On: 10/03/2023 14:30   DG Chest 2 View Result Date: 10/03/2023 CLINICAL DATA:  Fever. EXAM: CHEST - 2 VIEW COMPARISON:  None available. FINDINGS: Cardiomediastinal silhouette and pulmonary vasculature are within normal limits. Lungs are clear. The RIGHT hemidiaphragm is elevated. IMPRESSION: No acute cardiopulmonary process. Electronically Signed   By: Aliene Lloyd M.D.   On: 10/03/2023 12:55    Microbiology: Results for orders placed or performed during the hospital encounter of 10/03/23  Resp panel by RT-PCR (RSV, Flu A&B, Covid) Anterior Nasal Swab     Status: None   Collection Time: 10/03/23 11:25 AM   Specimen: Anterior Nasal Swab  Result Value Ref Range Status   SARS Coronavirus 2 by RT PCR NEGATIVE NEGATIVE Final    Comment: (NOTE) SARS-CoV-2 target nucleic acids are NOT DETECTED.  The SARS-CoV-2 RNA is generally detectable in upper respiratory specimens during the acute phase of infection. The lowest concentration of SARS-CoV-2 viral copies this assay can detect is 138 copies/mL. A negative result does not preclude SARS-Cov-2 infection and should not be used as the sole basis for treatment or other patient management decisions. A negative result may occur with  improper specimen collection/handling, submission of specimen other than nasopharyngeal swab, presence of viral mutation(s) within the areas targeted by this assay, and inadequate number of viral copies(<138 copies/mL). A negative result must be combined with clinical observations, patient history, and epidemiological information. The expected result is Negative.  Fact Sheet for Patients:  BloggerCourse.com  Fact Sheet for Healthcare Providers:  SeriousBroker.it  This test is no t yet approved or cleared by the [place]  FDA and  has been authorized for detection and/or diagnosis of SARS-CoV-2 by FDA under an Emergency Use Authorization (EUA). This EUA will  remain  in effect (meaning this test can be used) for the duration of the COVID-19 declaration under Section 564(b)(1) of the Act, 21 U.S.C.section 360bbb-3(b)(1), unless the authorization is terminated  or revoked sooner.       Influenza A by PCR NEGATIVE NEGATIVE Final   Influenza B by PCR NEGATIVE NEGATIVE Final    Comment: (NOTE) The Xpert Xpress SARS-CoV-2/FLU/RSV plus assay is intended as an aid in the diagnosis of influenza from Nasopharyngeal swab specimens and should not be used as a sole basis for treatment. Nasal washings and aspirates are unacceptable for Xpert Xpress SARS-CoV-2/FLU/RSV testing.  Fact Sheet for Patients: BloggerCourse.com  Fact Sheet for Healthcare Providers: SeriousBroker.it  This test is not yet approved or cleared by the [place]  FDA and has been authorized for detection and/or diagnosis of SARS-CoV-2 by FDA under an Emergency Use Authorization (EUA). This EUA will remain in effect (meaning this test can be used) for the duration of the COVID-19 declaration under Section 564(b)(1) of the Act, 21 U.S.C. section 360bbb-3(b)(1), unless the authorization is terminated or revoked.     Resp Syncytial Virus by PCR NEGATIVE NEGATIVE Final  Comment: (NOTE) Fact Sheet for Patients: BloggerCourse.com  Fact Sheet for Healthcare Providers: SeriousBroker.it  This test is not yet approved or cleared by the [place]  FDA and has been authorized for detection and/or diagnosis of SARS-CoV-2 by FDA under an Emergency Use Authorization (EUA). This EUA will remain in effect (meaning this test can be used) for the duration of the COVID-19 declaration under Section 564(b)(1) of the Act, 21 U.S.C. section 360bbb-3(b)(1), unless the authorization is terminated or revoked.  Performed at [facility], [address] [place], [place] [address]   Urine  Culture     Status: Abnormal   Collection Time: 10/03/23 12:32 PM   Specimen: Urine, Random  Result Value Ref Range Status   Specimen Description   Final    URINE, RANDOM Performed at [facility], [address] [place], [place] [address]    Special Requests   Final    NONE Reflexed from [id number] Performed at [facility], [address], [place], [place] [address]    Culture >=100,000 COLONIES/mL ESCHERICHIA COLI (A)  Final   Report Status 10/05/2023 FINAL  Final   Organism ID, Bacteria ESCHERICHIA COLI (A)  Final      Susceptibility   Escherichia coli - MIC*    AMPICILLIN >=32 RESISTANT Resistant     CEFAZOLIN  16 SENSITIVE Sensitive     CEFEPIME  <=0.12 SENSITIVE Sensitive     CEFTRIAXONE  <=0.25 SENSITIVE Sensitive     CIPROFLOXACIN  <=0.25 SENSITIVE Sensitive     GENTAMICIN <=1 SENSITIVE Sensitive     IMIPENEM <=0.25 SENSITIVE Sensitive     NITROFURANTOIN <=16 SENSITIVE Sensitive     TRIMETH/SULFA <=20 SENSITIVE Sensitive     AMPICILLIN/SULBACTAM >=32 RESISTANT Resistant     PIP/TAZO <=4 SENSITIVE Sensitive ug/mL    * >=100,000 COLONIES/mL ESCHERICHIA COLI  Blood culture (routine x 2)     Status: None (Preliminary result)   Collection Time: 10/03/23  1:04 PM   Specimen: BLOOD  Result Value Ref Range Status   Specimen Description BLOOD BLOOD RIGHT HAND  Final   Special Requests   Final    BOTTLES DRAWN AEROBIC AND ANAEROBIC Blood Culture results may not be optimal due to an inadequate volume of blood received in culture bottles   Culture   Final    NO GROWTH 3 DAYS Performed at [facility], [address] [place], [place] [address]    Report Status PENDING  Incomplete  Blood culture (routine x 2)     Status: None (Preliminary result)   Collection Time: 10/03/23  1:55 PM   Specimen: BLOOD LEFT HAND  Result Value Ref Range Status   Specimen Description   Final    BLOOD LEFT HAND BOTTLES DRAWN AEROBIC AND ANAEROBIC   Special Requests   Final    Blood Culture results may  not be optimal due to an inadequate volume of blood received in culture bottles   Culture   Final    NO GROWTH 3 DAYS Performed at [facility], [address], [place], [place] [address]    Report Status PENDING  Incomplete    Labs: CBC: Recent Labs  Lab 10/03/23 1137 10/04/23 0438 10/05/23 0433 10/06/23 0400  WBC 17.1* 13.7* 7.9 7.0  NEUTROABS 16.2*  --   --   --   HGB 13.6 11.4* 11.4* 13.4  HCT 40.8 35.8* 36.9* 41.6  MCV 83.1 86.9 88.1 86.0  PLT 217 157 168 216   Basic Metabolic Panel: Recent Labs  Lab 10/03/23 1137 10/04/23  9561 10/05/23 0433 10/06/23 0400  NA 132* 136 136 138  K 3.3* 4.2 3.4* 4.0  CL 102 106 103 101  CO2 15* 22 22 25   GLUCOSE 194* 123* 140* 183*  BUN 14 11 10 14   CREATININE 1.18 1.04 0.98 0.96  CALCIUM  8.8* 8.5* 8.1* 9.2  MG 1.7  --   --   --    Liver Function Tests: Recent Labs  Lab 10/03/23 1137 10/05/23 0433 10/06/23 0400  AST 41 35 57*  ALT 32 34 60*  ALKPHOS 98 77 92  BILITOT 1.9* 0.7 0.6  PROT 7.2 6.5 7.5  ALBUMIN 3.9 3.2* 3.7   CBG: No results for input(s): GLUCAP in the last 168 hours.  Discharge time spent: greater than 30 minutes.  Signed: Adriana DELENA Grams, MD [facility] 10/06/2023

## 2023-10-08 LAB — CULTURE, BLOOD (ROUTINE X 2)
Culture: NO GROWTH
Culture: NO GROWTH

## 2023-10-08 NOTE — Transitions of Care (Post Inpatient/ED Visit) (Signed)
 10/08/2023  Patient ID: Jose Logan, male   DOB: 1970/09/09, 53 y.o.   MRN: [id number]  [facility] RN CM chart review conducted for potential TOC post discharge outreach. It was noted that patient was discharged in inpatient/OBSERVATION status.   Bing Edison MSN, RN RN [employer] [facility]  [facility] Office Hours M-F [phone] Direct Dial: [phone] Main Phone [phone]  Fax: [phone] [email or web address]

## 2023-10-10 DIAGNOSIS — N410 Acute prostatitis: Secondary | ICD-10-CM | POA: Diagnosis not present

## 2023-10-10 DIAGNOSIS — E876 Hypokalemia: Secondary | ICD-10-CM | POA: Diagnosis not present

## 2023-10-10 DIAGNOSIS — Z6833 Body mass index (BMI) 33.0-33.9, adult: Secondary | ICD-10-CM | POA: Diagnosis not present

## 2023-10-15 DIAGNOSIS — I10 Essential (primary) hypertension: Secondary | ICD-10-CM | POA: Diagnosis not present

## 2023-10-31 DIAGNOSIS — Z130 Encounter for screening for diseases of the blood and blood-forming organs and certain disorders involving the immune mechanism: Secondary | ICD-10-CM | POA: Diagnosis not present

## 2023-10-31 DIAGNOSIS — R739 Hyperglycemia, unspecified: Secondary | ICD-10-CM | POA: Diagnosis not present

## 2023-10-31 DIAGNOSIS — E876 Hypokalemia: Secondary | ICD-10-CM | POA: Diagnosis not present

## 2023-10-31 DIAGNOSIS — Z125 Encounter for screening for malignant neoplasm of prostate: Secondary | ICD-10-CM | POA: Diagnosis not present

## 2023-10-31 DIAGNOSIS — Z1329 Encounter for screening for other suspected endocrine disorder: Secondary | ICD-10-CM | POA: Diagnosis not present

## 2023-10-31 DIAGNOSIS — R5382 Chronic fatigue, unspecified: Secondary | ICD-10-CM | POA: Diagnosis not present

## 2023-10-31 DIAGNOSIS — E7849 Other hyperlipidemia: Secondary | ICD-10-CM | POA: Diagnosis not present

## 2023-11-05 ENCOUNTER — Ambulatory Visit (INDEPENDENT_AMBULATORY_CARE_PROVIDER_SITE_OTHER): Admitting: Urology

## 2023-11-05 ENCOUNTER — Encounter: Payer: Self-pay | Admitting: Urology

## 2023-11-05 VITALS — BP 146/91 | HR 81

## 2023-11-05 DIAGNOSIS — N411 Chronic prostatitis: Secondary | ICD-10-CM | POA: Diagnosis not present

## 2023-11-05 DIAGNOSIS — E7849 Other hyperlipidemia: Secondary | ICD-10-CM | POA: Diagnosis not present

## 2023-11-05 DIAGNOSIS — I10 Essential (primary) hypertension: Secondary | ICD-10-CM | POA: Diagnosis not present

## 2023-11-05 DIAGNOSIS — Z0000 Encounter for general adult medical examination without abnormal findings: Secondary | ICD-10-CM | POA: Diagnosis not present

## 2023-11-05 DIAGNOSIS — Z23 Encounter for immunization: Secondary | ICD-10-CM | POA: Diagnosis not present

## 2023-11-05 DIAGNOSIS — G9009 Other idiopathic peripheral autonomic neuropathy: Secondary | ICD-10-CM | POA: Diagnosis not present

## 2023-11-05 DIAGNOSIS — N138 Other obstructive and reflux uropathy: Secondary | ICD-10-CM

## 2023-11-05 DIAGNOSIS — E782 Mixed hyperlipidemia: Secondary | ICD-10-CM | POA: Diagnosis not present

## 2023-11-05 DIAGNOSIS — R351 Nocturia: Secondary | ICD-10-CM

## 2023-11-05 DIAGNOSIS — N401 Enlarged prostate with lower urinary tract symptoms: Secondary | ICD-10-CM | POA: Diagnosis not present

## 2023-11-05 DIAGNOSIS — N390 Urinary tract infection, site not specified: Secondary | ICD-10-CM | POA: Diagnosis not present

## 2023-11-05 DIAGNOSIS — Z6833 Body mass index (BMI) 33.0-33.9, adult: Secondary | ICD-10-CM | POA: Diagnosis not present

## 2023-11-05 DIAGNOSIS — R319 Hematuria, unspecified: Secondary | ICD-10-CM | POA: Diagnosis not present

## 2023-11-05 DIAGNOSIS — F331 Major depressive disorder, recurrent, moderate: Secondary | ICD-10-CM | POA: Diagnosis not present

## 2023-11-05 LAB — URINALYSIS, ROUTINE W REFLEX MICROSCOPIC
Bilirubin, UA: NEGATIVE
Glucose, UA: NEGATIVE
Ketones, UA: NEGATIVE
Leukocytes,UA: NEGATIVE
Nitrite, UA: NEGATIVE
Specific Gravity, UA: 1.03 (ref 1.005–1.030)
Urobilinogen, Ur: 0.2 mg/dL (ref 0.2–1.0)
pH, UA: 6 (ref 5.0–7.5)

## 2023-11-05 LAB — MICROSCOPIC EXAMINATION: Bacteria, UA: NONE SEEN

## 2023-11-05 LAB — BLADDER SCAN AMB NON-IMAGING: Scan Result: 0

## 2023-11-05 MED ORDER — SULFAMETHOXAZOLE-TRIMETHOPRIM 800-160 MG PO TABS: 1.0000 | ORAL_TABLET | Freq: Two times a day (BID) | ORAL | 0 refills | Status: AC

## 2023-11-05 NOTE — Patient Instructions (Signed)

## 2023-11-05 NOTE — Progress Notes (Unsigned)
 11/05/2023 11:18 AM   Jose Logan 1970-11-23 [id number]  Referring provider: Willette Adriana LABOR, MD [address] [place],  [place] [address]  Prostatitis   HPI: Mr Hemmer is 53yo here for evaluation of BPH and acute prostatitis. He was admitted with sepsis from acute prostatitis. He was discharged with a 7 day course of cipro . IPSS 8 QOL 0. Nocturia 2x. Nocturia is new since discharge from the hospital. PVR 0cc. No prior issues with prostatitis.    PMH: Past Medical History:  Diagnosis Date   Hypertension    Lumbar pain    Numbness    top of right foot and left toes, dr duwayne aware for last year    Surgical History: Past Surgical History:  Procedure Laterality Date   hemorroid surgery and colonscopy     LUMBAR LAMINECTOMY/DECOMPRESSION MICRODISCECTOMY N/A 09/14/2016   Procedure: Bilateral Decompression L4-5;  Surgeon: duwayne Purchase, MD;  Location: [facility] ORS;  Service: Orthopedics;  Laterality: N/A;  120 mins    Home Medications:  Allergies as of 11/05/2023       Reactions   Tetracyclines & Related Rash        Medication List        Accurate as of November 05, 2023 11:18 AM. If you have any questions, ask your nurse or doctor.          acetaminophen  325 MG tablet Commonly known as: TYLENOL  Take 2 tablets (650 mg total) by mouth every 6 (six) hours as needed for mild pain (pain score 1-3) or fever (or Fever >/= 101).   fluticasone 50 MCG/ACT nasal spray Commonly known as: FLONASE Place 1 spray into both nostrils daily.   ibuprofen 200 MG tablet Commonly known as: ADVIL Take 400 mg by mouth every 6 (six) hours as needed for moderate pain (pain score 4-6).   Nucynta  75 MG tablet Generic drug: tapentadol  HCl Take 75 mg by mouth 3 (three) times daily as needed.   phenazopyridine  95 MG tablet Commonly known as: PYRIDIUM  Take 1 tablet (95 mg total) by mouth 3 (three) times daily as needed for pain.   pregabalin  200 MG capsule Commonly known as:  LYRICA  Take 200 mg by mouth 3 (three) times daily.   rosuvastatin  10 MG tablet Commonly known as: CRESTOR  Take 10 mg by mouth every other day.   traZODone 50 MG tablet Commonly known as: DESYREL Take 50 mg by mouth at bedtime as needed.   valsartan -hydrochlorothiazide  160-12.5 MG tablet Commonly known as: DIOVAN -HCT Take 1 tablet by mouth daily.        Allergies:  Allergies  Allergen Reactions   Tetracyclines & Related Rash    Family History: No family history on file.  Social History:  reports that he has never smoked. He uses smokeless tobacco. He reports current alcohol  use. He reports that he does not use drugs.  ROS: All other review of systems were reviewed and are negative except what is noted above in HPI  Physical Exam: BP (!) 146/91   Pulse 81   Constitutional:  Alert and oriented, No acute distress. HEENT: [place] AT, moist mucus membranes.  Trachea midline, no masses. Cardiovascular: No clubbing, cyanosis, or edema. Respiratory: Normal respiratory effort, no increased work of breathing. GI: Abdomen is soft, nontender, nondistended, no abdominal masses GU: No CVA tenderness.  Lymph: No cervical or inguinal lymphadenopathy. Skin: No rashes, bruises or suspicious lesions. Neurologic: Grossly intact, no focal deficits, moving all 4 extremities. Psychiatric: Normal mood and affect.  Laboratory Data: Lab Results  Component Value Date   WBC 7.0 10/06/2023   HGB 13.4 10/06/2023   HCT 41.6 10/06/2023   MCV 86.0 10/06/2023   PLT 216 10/06/2023    Lab Results  Component Value Date   CREATININE 0.96 10/06/2023    No results found for: PSA  No results found for: TESTOSTERONE  Lab Results  Component Value Date   HGBA1C 5.7 (H) 01/25/2017    Urinalysis    Component Value Date/Time   COLORURINE YELLOW 10/03/2023 1232   APPEARANCEUR CLOUDY (A) 10/03/2023 1232   LABSPEC 1.021 10/03/2023 1232   PHURINE 6.0 10/03/2023 1232   GLUCOSEU NEGATIVE  10/03/2023 1232   HGBUR MODERATE (A) 10/03/2023 1232   BILIRUBINUR NEGATIVE 10/03/2023 1232   KETONESUR 5 (A) 10/03/2023 1232   PROTEINUR 100 (A) 10/03/2023 1232   NITRITE POSITIVE (A) 10/03/2023 1232   LEUKOCYTESUR MODERATE (A) 10/03/2023 1232    Lab Results  Component Value Date   BACTERIA RARE (A) 10/03/2023    Pertinent Imaging: CT 10/03/23: Images reviewed and discussed with the patient No results found for this or any previous visit.  No results found for this or any previous visit.  No results found for this or any previous visit.  No results found for this or any previous visit.  No results found for this or any previous visit.  No results found for this or any previous visit.  No results found for this or any previous visit.  Results for orders placed during the hospital encounter of 10/03/23  CT Renal Stone Study  Narrative CLINICAL DATA:  Fever, short of breath, nausea, dysuria, decreased urine output  EXAM: CT ABDOMEN AND PELVIS WITHOUT CONTRAST  TECHNIQUE: Multidetector CT imaging of the abdomen and pelvis was performed following the standard protocol without IV contrast.  RADIATION DOSE REDUCTION: This exam was performed according to the departmental dose-optimization program which includes automated exposure control, adjustment of the mA and/or kV according to patient size and/or use of iterative reconstruction technique.  COMPARISON:  None Available.  FINDINGS: Lower chest: No acute pleural or parenchymal lung disease.  Hepatobiliary: Decreased liver attenuation consistent with hepatic steatosis. Gallbladder is unremarkable. No biliary duct dilation.  Pancreas: Unremarkable unenhanced appearance.  Spleen: Unremarkable unenhanced appearance.  Adrenals/Urinary Tract: No urinary tract calculi or obstructive uropathy within either kidney. The adrenals are unremarkable. The bladder is decompressed, limiting its evaluation.  Stomach/Bowel: No  bowel obstruction or ileus. Normal appendix right lower quadrant. Scattered distal colonic diverticulosis without evidence of acute diverticulitis.  Vascular/Lymphatic: Aortic atherosclerosis. No enlarged abdominal or pelvic lymph nodes.  Reproductive: The prostate is not enlarged. There is mild fat stranding surrounding the prostate and seminal vesicles, which could reflect underlying prostatitis.  Other: No free fluid or free intraperitoneal gas. No abdominal wall hernia.  Musculoskeletal: No acute or destructive bony abnormalities. Reconstructed images demonstrate no additional findings.  IMPRESSION: 1. Fat stranding surrounding the prostate and bilateral seminal vesicles, which could reflect sequela of prostatitis. The prostate is not enlarged. 2. No evidence of urinary tract calculi or obstructive uropathy. 3. Distal colonic diverticulosis without diverticulitis. 4.  Aortic Atherosclerosis (ICD10-I70.0). 5. Hepatic steatosis.   Electronically Signed By: Ozell Daring M.D. On: 10/03/2023 14:30   Assessment & Plan:    1. Chronic prostatitis -bactrim  DS BID for 21 days - Urinalysis, Routine w reflex microscopic - BLADDER SCAN AMB NON-IMAGING  2. Benign prostatic hyperplasia with urinary obstruction -we will defer therapy at this time  3.  Nocturia -decrease fluid intake within 2 hours of going to bed   No follow-ups on file.  Belvie Clara, MD  [facility] [place]

## 2023-11-06 ENCOUNTER — Encounter (INDEPENDENT_AMBULATORY_CARE_PROVIDER_SITE_OTHER): Payer: Self-pay | Admitting: *Deleted

## 2023-11-13 ENCOUNTER — Telehealth (INDEPENDENT_AMBULATORY_CARE_PROVIDER_SITE_OTHER): Payer: Self-pay

## 2023-11-13 NOTE — Telephone Encounter (Signed)
 Room 1 Thanks

## 2023-11-13 NOTE — Telephone Encounter (Signed)
 Who is your primary care physician: Dr. Jerel Kipper  Reasons for the colonoscopy: history of colon polyps  Have you had a colonoscopy before?  Yes, 2018  Do you have family history of colon cancer? no  Previous colonoscopy with polyps removed? Yes, 2018  Do you have a history colorectal cancer?   no  Are you diabetic? If yes, Type 1 or Type 2?    no  Do you have a prosthetic or mechanical heart valve? no  Do you have a pacemaker/defibrillator?   no  Have you had endocarditis/atrial fibrillation? no  Have you had joint replacement within the last 12 months?  no  Do you tend to be constipated or have to use laxatives? no  Do you have any history of drugs or alchohol?  no  Do you use supplemental oxygen?  no  Have you had a stroke or heart attack within the last 6 months? no  Do you take weight loss medication?  No  Do you take any blood-thinning medications such as: (aspirin, warfarin, Plavix, Aggrenox)  no  If yes we need the name, milligram, dosage and who is prescribing doctor  Current Outpatient Medications on File Prior to Visit  Medication Sig Dispense Refill   NUCYNTA  75 MG tablet Take 75 mg by mouth 3 (three) times daily as needed.     pregabalin  (LYRICA ) 200 MG capsule Take 200 mg by mouth 3 (three) times daily.     rosuvastatin  (CRESTOR ) 10 MG tablet Take 10 mg by mouth every other day.     sulfamethoxazole -trimethoprim  (BACTRIM  DS) 800-160 MG tablet Take 1 tablet by mouth every 12 (twelve) hours. 42 tablet 0   valsartan -hydrochlorothiazide  (DIOVAN -HCT) 160-12.5 MG tablet Take 1 tablet by mouth daily.     No current facility-administered medications on file prior to visit.    Allergies  Allergen Reactions   Tetracyclines & Related Rash     Pharmacy: [facility]  Primary Insurance Name: [facility] [employer]  Best number where you can be reached: [phone]

## 2023-11-14 NOTE — Telephone Encounter (Signed)
 Called pt, no answer. LVM for a call back.

## 2023-11-15 NOTE — Telephone Encounter (Signed)
 Pt left VM, returned call, LMOVM

## 2023-11-19 ENCOUNTER — Telehealth (INDEPENDENT_AMBULATORY_CARE_PROVIDER_SITE_OTHER): Payer: Self-pay

## 2023-11-19 MED ORDER — PEG 3350-KCL-NA BICARB-NACL 420 G PO SOLR: 4000.0000 mL | Freq: Once | ORAL | 0 refills | Status: AC

## 2023-11-19 NOTE — Telephone Encounter (Signed)
 Understood, thanks for the information.  Please reassure him that we have all types of scopes and we will give it our best to complete his colonoscopy. He should proceed with the procedure.

## 2023-11-19 NOTE — Telephone Encounter (Signed)
 Spoke with patient, he stated that he had a colonoscopy with [facility] in [place] (now [facility]) and that they could not complete his colonoscopy due to the fact that they did not have a scope small enough to fit. Patient stated he had a hemorrhoid procedure and that there is too much skin in the rectal area. Please advise.

## 2023-11-19 NOTE — Telephone Encounter (Signed)
 Pt left vm requesting call back. Called pt, no answer. Lvm for call back.

## 2023-11-19 NOTE — Telephone Encounter (Signed)
 Spoke with patient, scheduled TCS for 11/28/2023 at 9:00am. Rx sent to pharmacy, instructions sent to mychart.

## 2023-11-19 NOTE — Addendum Note (Signed)
 Addended by: DALLIE LIONEL RAMAN on: 11/19/2023 04:31 PM   Modules accepted: Orders

## 2023-11-20 ENCOUNTER — Encounter (INDEPENDENT_AMBULATORY_CARE_PROVIDER_SITE_OTHER): Payer: Self-pay | Admitting: *Deleted

## 2023-11-20 NOTE — Telephone Encounter (Signed)
 Referral completed, TCS apt letter sent to PCP

## 2023-11-28 ENCOUNTER — Encounter (HOSPITAL_COMMUNITY)
Admission: RE | Disposition: A | Discharge status: Home / Self Care | Payer: Self-pay | Source: Home / Self Care | Attending: Gastroenterology

## 2023-11-28 ENCOUNTER — Encounter (INDEPENDENT_AMBULATORY_CARE_PROVIDER_SITE_OTHER): Payer: Self-pay | Admitting: *Deleted

## 2023-11-28 ENCOUNTER — Encounter (HOSPITAL_COMMUNITY): Payer: Self-pay | Admitting: Gastroenterology

## 2023-11-28 ENCOUNTER — Ambulatory Visit (HOSPITAL_COMMUNITY): Admitting: Anesthesiology

## 2023-11-28 ENCOUNTER — Ambulatory Visit (HOSPITAL_COMMUNITY)
Admission: RE | Admit: 2023-11-28 | Discharge: 2023-11-28 | Disposition: A | Discharge status: Home / Self Care | Attending: Gastroenterology | Admitting: Gastroenterology

## 2023-11-28 ENCOUNTER — Other Ambulatory Visit: Payer: Self-pay

## 2023-11-28 DIAGNOSIS — D120 Benign neoplasm of cecum: Secondary | ICD-10-CM | POA: Diagnosis not present

## 2023-11-28 DIAGNOSIS — D125 Benign neoplasm of sigmoid colon: Secondary | ICD-10-CM

## 2023-11-28 DIAGNOSIS — K635 Polyp of colon: Secondary | ICD-10-CM | POA: Diagnosis not present

## 2023-11-28 DIAGNOSIS — K5140 Inflammatory polyps of colon without complications: Secondary | ICD-10-CM

## 2023-11-28 DIAGNOSIS — D122 Benign neoplasm of ascending colon: Secondary | ICD-10-CM

## 2023-11-28 DIAGNOSIS — I10 Essential (primary) hypertension: Secondary | ICD-10-CM | POA: Diagnosis not present

## 2023-11-28 DIAGNOSIS — Z1211 Encounter for screening for malignant neoplasm of colon: Secondary | ICD-10-CM | POA: Diagnosis not present

## 2023-11-28 DIAGNOSIS — K5730 Diverticulosis of large intestine without perforation or abscess without bleeding: Secondary | ICD-10-CM

## 2023-11-28 DIAGNOSIS — K6289 Other specified diseases of anus and rectum: Secondary | ICD-10-CM | POA: Diagnosis not present

## 2023-11-28 DIAGNOSIS — Z860100 Personal history of colon polyps, unspecified: Secondary | ICD-10-CM

## 2023-11-28 DIAGNOSIS — D124 Benign neoplasm of descending colon: Secondary | ICD-10-CM | POA: Diagnosis not present

## 2023-11-28 HISTORY — PX: COLONOSCOPY: SHX5424

## 2023-11-28 LAB — HM COLONOSCOPY

## 2023-11-28 SURGERY — COLONOSCOPY
Anesthesia: General

## 2023-11-28 MED ORDER — LIDOCAINE 2% (20 MG/ML) 5 ML SYRINGE
INTRAMUSCULAR | Status: AC
  Filled 2023-11-28: qty 5

## 2023-11-28 MED ORDER — LACTATED RINGERS IV SOLN: INTRAVENOUS | Status: DC

## 2023-11-28 MED ORDER — PROPOFOL 10 MG/ML IV BOLUS
INTRAVENOUS | Status: DC | PRN
  Administered 2023-11-28: 40 mg via INTRAVENOUS
  Administered 2023-11-28: 60 mg via INTRAVENOUS
  Administered 2023-11-28: 150 ug/kg/min via INTRAVENOUS

## 2023-11-28 MED ORDER — LIDOCAINE 2% (20 MG/ML) 5 ML SYRINGE
INTRAMUSCULAR | Status: DC | PRN
  Administered 2023-11-28: 100 mg via INTRAVENOUS

## 2023-11-28 MED ORDER — PROPOFOL 500 MG/50ML IV EMUL
INTRAVENOUS | Status: AC
  Filled 2023-11-28: qty 50

## 2023-11-28 NOTE — Anesthesia Preprocedure Evaluation (Signed)
 Anesthesia Evaluation  Patient identified by MRN, date of birth, ID band Patient awake    Reviewed: Allergy & Precautions, H&P , NPO status , Patient's Chart, lab work & pertinent test results, reviewed documented beta blocker date and time   Airway Mallampati: II  TM Distance: >3 FB Neck ROM: full    Dental no notable dental hx. (+) Dental Advisory Given, Teeth Intact   Pulmonary neg pulmonary ROS   Pulmonary exam normal breath sounds clear to auscultation       Cardiovascular Exercise Tolerance: Good hypertension, Normal cardiovascular exam Rhythm:regular Rate:Normal     Neuro/Psych negative neurological ROS  negative psych ROS   GI/Hepatic negative GI ROS, Neg liver ROS,,,  Endo/Other  negative endocrine ROS    Renal/GU negative Renal ROS  negative genitourinary   Musculoskeletal   Abdominal   Peds  Hematology negative hematology ROS (+)   Anesthesia Other Findings   Reproductive/Obstetrics negative OB ROS                              Anesthesia Physical Anesthesia Plan  ASA: 2  Anesthesia Plan: General   Post-op Pain Management: Minimal or no pain anticipated   Induction: Intravenous  PONV Risk Score and Plan: Propofol  infusion  Airway Management Planned: Natural Airway and Nasal Cannula  Additional Equipment: None  Intra-op Plan:   Post-operative Plan:   Informed Consent: I have reviewed the patients History and Physical, chart, labs and discussed the procedure including the risks, benefits and alternatives for the proposed anesthesia with the patient or authorized representative who has indicated his/her understanding and acceptance.     Dental Advisory Given  Plan Discussed with: CRNA  Anesthesia Plan Comments:         Anesthesia Quick Evaluation

## 2023-11-28 NOTE — Discharge Instructions (Signed)
 You are being discharged to home.  Resume your previous diet.  We are waiting for your pathology results.  Your physician has recommended a repeat colonoscopy in three years because the bowel preparation was suboptimal.

## 2023-11-28 NOTE — Transfer of Care (Signed)
 Immediate Anesthesia Transfer of Care Note  Patient: Jose Logan  Procedure(s) Performed: COLONOSCOPY  Patient Location: PACU  Anesthesia Type:General  Level of Consciousness: awake and alert   Airway & Oxygen Therapy: Patient connected to nasal cannula oxygen  Post-op Assessment: Report given to RN and Post -op Vital signs reviewed and stable  Post vital signs: Reviewed  Last Vitals:  Vitals Value Taken Time  BP 140/68 0932  Temp 97.2 0932  Pulse 75 0932  Resp 12 0932  SpO2 94 0932    Last Pain:  Vitals:   11/28/23 0847  TempSrc:   PainSc: 0-No pain      Patients Stated Pain Goal: 4 (11/28/23 0752)  Complications: There were no known notable events for this encounter.

## 2023-11-28 NOTE — Anesthesia Postprocedure Evaluation (Signed)
 Anesthesia Post Note  Patient: Jose Logan  Procedure(s) Performed: COLONOSCOPY  Patient location during evaluation: Endoscopy Anesthesia Type: General Level of consciousness: awake and alert Pain management: pain level controlled Vital Signs Assessment: post-procedure vital signs reviewed and stable Respiratory status: spontaneous breathing, nonlabored ventilation and respiratory function stable Cardiovascular status: stable Anesthetic complications: no   There were no known notable events for this encounter.   Last Vitals:  Vitals:   11/28/23 0752 11/28/23 0931  BP: 133/82 (!) 101/50  Pulse: 72 88  Resp: 14 13  Temp: 36.5 C 36.6 C  SpO2: 98% 95%    Last Pain:  Vitals:   11/28/23 0931  TempSrc: Axillary  PainSc: 0-No pain                 Pacey Willadsen L Delvonte Berenson

## 2023-11-28 NOTE — H&P (Signed)
 Jose Logan is an 53 y.o. male.   Chief Complaint: history colon polyps HPI: 52 y/o M with PMH HTN, coming for history of colon polyps.  States his last colonoscopy was 5 years ago and it was normal but has had colon polyps in 2 prior colonoscopies.  The patient denies having any complaints such as melena, hematochezia, abdominal pain or distention, change in her bowel movement consistency or frequency, no changes in weight recently.  No family history of colorectal cancer.   Past Medical History:  Diagnosis Date   Hypertension    Lumbar pain    Numbness    top of right foot and left toes, dr duwayne aware for last year    Past Surgical History:  Procedure Laterality Date   hemorroid surgery and colonscopy     LUMBAR LAMINECTOMY/DECOMPRESSION MICRODISCECTOMY N/A 09/14/2016   Procedure: Bilateral Decompression L4-5;  Surgeon: duwayne Purchase, MD;  Location: [facility];  Service: Orthopedics;  Laterality: N/A;  120 mins    History reviewed. No pertinent family history. Social History:  reports that he has never smoked. He uses smokeless tobacco. He reports current alcohol  use. He reports that he does not use drugs.  Allergies:  Allergies  Allergen Reactions   Tetracyclines & Related Rash    Medications Prior to Admission  Medication Sig Dispense Refill   NUCYNTA  75 MG tablet Take 75 mg by mouth 3 (three) times daily as needed.     pregabalin  (LYRICA ) 200 MG capsule Take 200 mg by mouth 3 (three) times daily.     rosuvastatin  (CRESTOR ) 10 MG tablet Take 10 mg by mouth every other day.     sulfamethoxazole -trimethoprim  (BACTRIM  DS) 800-160 MG tablet Take 1 tablet by mouth every 12 (twelve) hours. 42 tablet 0   valsartan -hydrochlorothiazide  (DIOVAN -HCT) 160-12.5 MG tablet Take 1 tablet by mouth daily.      No results found for this or any previous visit (from the past 48 hours). No results found.  Review of Systems  All other systems reviewed and are negative.   Blood pressure  133/82, pulse 72, temperature 97.7 F (36.5 C), temperature source Oral, resp. rate 14, height 5' 9 (1.753 m), weight 99.8 kg, SpO2 98%. Physical Exam  GENERAL: The patient is AO x3, in no acute distress. HEENT: Head is normocephalic and atraumatic. EOMI are intact. Mouth is well hydrated and without lesions. NECK: Supple. No masses LUNGS: Clear to auscultation. No presence of rhonchi/wheezing/rales. Adequate chest expansion HEART: RRR, normal s1 and s2. ABDOMEN: Soft, nontender, no guarding, no peritoneal signs, and nondistended. BS +. No masses. EXTREMITIES: Without any cyanosis, clubbing, rash, lesions or edema. NEUROLOGIC: AOx3, no focal motor deficit. SKIN: no jaundice, no rashes  Assessment/Plan 53 y/o M with PMH HTN, coming for history of colon polyps.  Will proceed with colonoscopy.  Toribio Eartha Flavors, MD 11/28/2023, 8:43 AM

## 2023-11-28 NOTE — Op Note (Signed)
 [facility] Patient Name: Jose Logan Procedure Date: 11/28/2023 8:37 AM MRN: [id number] Date of Birth: 08/20/1970 Attending MD: Toribio Fortune , , [id number] CSN: [id number] Age: 53 Admit Type: Outpatient Procedure:                Colonoscopy Indications:              Surveillance: Personal history of colonic polyps                            (unknown histology) on last colonoscopy 5 years ago Providers:                Toribio Fortune, Crystal Page, Dorcas Lenis,                            Technician Referring MD:              Medicines:                Monitored Anesthesia Care Complications:            No immediate complications. Estimated Blood Loss:     Estimated blood loss: none. Procedure:                Pre-Anesthesia Assessment:                           - Prior to the procedure, a History and Physical                            was performed, and patient medications, allergies                            and sensitivities were reviewed. The patient's                            tolerance of previous anesthesia was reviewed.                           - The risks and benefits of the procedure and the                            sedation options and risks were discussed with the                            patient. All questions were answered and informed                            consent was obtained.                           - ASA Grade Assessment: II - A patient with mild                            systemic disease.                           After obtaining informed consent, the colonoscope  was passed under direct vision. Throughout the                            procedure, the patient's blood pressure, pulse, and                            oxygen saturations were monitored continuously. The                            [id number] ([id number]) was introduced through the                            anus and advanced to the the cecum, identified  by                            appendiceal orifice and ileocecal valve. The                            colonoscopy was performed without difficulty. The                            patient tolerated the procedure well. The quality                            of the bowel preparation was adequate to identify                            polyps greater than 5 mm in size. Scope In: 8:52:49 AM Scope Out: 9:26:37 AM Scope Withdrawal Time: 0 hours 25 minutes 44 seconds  Total Procedure Duration: 0 hours 33 minutes 48 seconds  Findings:      The perianal and digital rectal examinations were normal.      Two sessile polyps were found in the ascending colon and cecum. The       polyps were 2 to 4 mm in size. These polyps were removed with a cold       snare. Resection and retrieval were complete.      Three sessile polyps were found in the sigmoid colon and descending       colon. The polyps were 3 to 6 mm in size. These polyps were removed with       a cold snare. Resection and retrieval were complete.      Scattered medium-mouthed and small-mouthed diverticula were found in the       sigmoid colon, descending colon and ascending colon.      An area of moderately congested mucosa was found in the mid rectum, it       had purplish discoloration. Biopsies were taken with a cold forceps for       histology. Impression:               - Two 2 to 4 mm polyps in the ascending colon and                            in the cecum, removed with a cold snare. Resected  and retrieved.                           - Three 3 to 6 mm polyps in the sigmoid colon and                            in the descending colon, removed with a cold snare.                            Resected and retrieved.                           - Diverticulosis in the sigmoid colon, in the                            descending colon and in the ascending colon.                           - Congested mucosa in the mid  rectum. Biopsied. Moderate Sedation:      Per Anesthesia Care Recommendation:           - Discharge patient to home (ambulatory).                           - Resume previous diet.                           - Await pathology results.                           - Repeat colonoscopy in 3 years because the bowel                            preparation was suboptimal. Procedure Code(s):        --- Professional ---                           [id number], Colonoscopy, flexible; with removal of                            tumor(s), polyp(s), or other lesion(s) by snare                            technique                           45380, 59, Colonoscopy, flexible; with biopsy,                            single or multiple Diagnosis Code(s):        --- Professional ---                           Z86.010, Personal history of colonic polyps                           D12.2,  Benign neoplasm of ascending colon                           D12.0, Benign neoplasm of cecum                           D12.5, Benign neoplasm of sigmoid colon                           D12.4, Benign neoplasm of descending colon                           K62.89, Other specified diseases of anus and rectum                           K57.30, Diverticulosis of large intestine without                            perforation or abscess without bleeding CPT copyright 2022 [employer]. All rights reserved. The codes documented in this report are preliminary and upon coder review may  be revised to meet current compliance requirements. Toribio Fortune, MD Toribio Fortune,  11/28/2023 9:35:14 AM This report has been signed electronically. Number of Addenda: 0

## 2023-11-29 ENCOUNTER — Ambulatory Visit (INDEPENDENT_AMBULATORY_CARE_PROVIDER_SITE_OTHER): Payer: Self-pay | Admitting: Gastroenterology

## 2023-11-29 ENCOUNTER — Encounter (HOSPITAL_COMMUNITY): Payer: Self-pay | Admitting: Gastroenterology

## 2023-11-29 LAB — SURGICAL PATHOLOGY

## 2023-11-29 NOTE — Progress Notes (Signed)
 5 yr TCS noted in recall Patient result letter mailed procedure note and pathology result faxed to PCP

## 2023-12-06 DIAGNOSIS — L110 Acquired keratosis follicularis: Secondary | ICD-10-CM | POA: Diagnosis not present

## 2023-12-06 DIAGNOSIS — S93332D Other subluxation of left foot, subsequent encounter: Secondary | ICD-10-CM | POA: Diagnosis not present

## 2023-12-06 DIAGNOSIS — M779 Enthesopathy, unspecified: Secondary | ICD-10-CM | POA: Diagnosis not present

## 2023-12-06 DIAGNOSIS — S93331D Other subluxation of right foot, subsequent encounter: Secondary | ICD-10-CM | POA: Diagnosis not present

## 2023-12-12 ENCOUNTER — Encounter (INDEPENDENT_AMBULATORY_CARE_PROVIDER_SITE_OTHER): Payer: Self-pay | Admitting: Gastroenterology

## 2024-01-18 DIAGNOSIS — G5602 Carpal tunnel syndrome, left upper limb: Secondary | ICD-10-CM | POA: Diagnosis not present

## 2024-01-18 DIAGNOSIS — G5601 Carpal tunnel syndrome, right upper limb: Secondary | ICD-10-CM | POA: Diagnosis not present

## 2024-01-29 ENCOUNTER — Other Ambulatory Visit

## 2024-02-04 DIAGNOSIS — E7849 Other hyperlipidemia: Secondary | ICD-10-CM | POA: Diagnosis not present

## 2024-02-04 DIAGNOSIS — E876 Hypokalemia: Secondary | ICD-10-CM | POA: Diagnosis not present

## 2024-02-04 DIAGNOSIS — I10 Essential (primary) hypertension: Secondary | ICD-10-CM | POA: Diagnosis not present

## 2024-02-04 DIAGNOSIS — Z0001 Encounter for general adult medical examination with abnormal findings: Secondary | ICD-10-CM | POA: Diagnosis not present

## 2024-02-06 ENCOUNTER — Ambulatory Visit: Admitting: Urology

## 2024-02-07 DIAGNOSIS — G9009 Other idiopathic peripheral autonomic neuropathy: Secondary | ICD-10-CM | POA: Diagnosis not present

## 2024-02-07 DIAGNOSIS — I10 Essential (primary) hypertension: Secondary | ICD-10-CM | POA: Diagnosis not present

## 2024-02-07 DIAGNOSIS — Z1331 Encounter for screening for depression: Secondary | ICD-10-CM | POA: Diagnosis not present

## 2024-02-07 DIAGNOSIS — F331 Major depressive disorder, recurrent, moderate: Secondary | ICD-10-CM | POA: Diagnosis not present

## 2024-02-07 DIAGNOSIS — Z1389 Encounter for screening for other disorder: Secondary | ICD-10-CM | POA: Diagnosis not present

## 2024-02-07 DIAGNOSIS — Z6833 Body mass index (BMI) 33.0-33.9, adult: Secondary | ICD-10-CM | POA: Diagnosis not present

## 2024-02-07 DIAGNOSIS — Z0001 Encounter for general adult medical examination with abnormal findings: Secondary | ICD-10-CM | POA: Diagnosis not present

## 2024-02-07 DIAGNOSIS — G5603 Carpal tunnel syndrome, bilateral upper limbs: Secondary | ICD-10-CM | POA: Diagnosis not present

## 2024-02-15 DIAGNOSIS — G5603 Carpal tunnel syndrome, bilateral upper limbs: Secondary | ICD-10-CM | POA: Diagnosis not present

## 2024-02-17 DIAGNOSIS — G5603 Carpal tunnel syndrome, bilateral upper limbs: Secondary | ICD-10-CM | POA: Diagnosis not present

## 2024-04-08 ENCOUNTER — Ambulatory Visit: Admitting: Neurology
